# Patient Record
Sex: Female | Born: 1960 | Race: Black or African American | Hispanic: No | Marital: Married | Smoking: Never smoker
Health system: Southern US, Community
[De-identification: ages and names within clinical notes are randomized; demographics above are authoritative.]

## PROBLEM LIST (undated history)

## (undated) DIAGNOSIS — E785 Hyperlipidemia, unspecified: Secondary | ICD-10-CM

## (undated) DIAGNOSIS — I10 Essential (primary) hypertension: Secondary | ICD-10-CM

## (undated) DIAGNOSIS — N390 Urinary tract infection, site not specified: Secondary | ICD-10-CM

## (undated) DIAGNOSIS — B019 Varicella without complication: Secondary | ICD-10-CM

## (undated) HISTORY — DX: Varicella without complication: B01.9

## (undated) HISTORY — DX: Urinary tract infection, site not specified: N39.0

## (undated) HISTORY — PX: TUBAL LIGATION: SHX77

## (undated) HISTORY — DX: Hyperlipidemia, unspecified: E78.5

---

## 2014-12-05 ENCOUNTER — Other Ambulatory Visit: Payer: Self-pay

## 2014-12-05 ENCOUNTER — Emergency Department (HOSPITAL_BASED_OUTPATIENT_CLINIC_OR_DEPARTMENT_OTHER)
Admission: EM | Admit: 2014-12-05 | Discharge: 2014-12-05 | Disposition: A | Discharge status: Home / Self Care | Payer: 59 | Attending: Emergency Medicine | Admitting: Emergency Medicine

## 2014-12-05 ENCOUNTER — Encounter (HOSPITAL_BASED_OUTPATIENT_CLINIC_OR_DEPARTMENT_OTHER): Payer: Self-pay | Admitting: *Deleted

## 2014-12-05 DIAGNOSIS — R05 Cough: Secondary | ICD-10-CM | POA: Diagnosis not present

## 2014-12-05 DIAGNOSIS — R51 Headache: Secondary | ICD-10-CM | POA: Diagnosis present

## 2014-12-05 DIAGNOSIS — I10 Essential (primary) hypertension: Secondary | ICD-10-CM | POA: Diagnosis not present

## 2014-12-05 DIAGNOSIS — R0981 Nasal congestion: Secondary | ICD-10-CM | POA: Insufficient documentation

## 2014-12-05 HISTORY — DX: Essential (primary) hypertension: I10

## 2014-12-05 LAB — CBC WITH DIFFERENTIAL/PLATELET
Basophils Absolute: 0 10*3/uL (ref 0.0–0.1)
Basophils Relative: 0 % (ref 0–1)
EOS ABS: 0.2 10*3/uL (ref 0.0–0.7)
EOS PCT: 3 % (ref 0–5)
HCT: 38.5 % (ref 36.0–46.0)
Hemoglobin: 12.8 g/dL (ref 12.0–15.0)
Lymphocytes Relative: 36 % (ref 12–46)
Lymphs Abs: 2.7 10*3/uL (ref 0.7–4.0)
MCH: 30.4 pg (ref 26.0–34.0)
MCHC: 33.2 g/dL (ref 30.0–36.0)
MCV: 91.4 fL (ref 78.0–100.0)
Monocytes Absolute: 0.7 10*3/uL (ref 0.1–1.0)
Monocytes Relative: 9 % (ref 3–12)
NEUTROS PCT: 52 % (ref 43–77)
Neutro Abs: 3.9 10*3/uL (ref 1.7–7.7)
PLATELETS: 234 10*3/uL (ref 150–400)
RBC: 4.21 MIL/uL (ref 3.87–5.11)
RDW: 12.7 % (ref 11.5–15.5)
WBC: 7.5 10*3/uL (ref 4.0–10.5)

## 2014-12-05 LAB — BASIC METABOLIC PANEL
ANION GAP: 4 — AB (ref 5–15)
BUN: 12 mg/dL (ref 6–23)
CO2: 28 mmol/L (ref 19–32)
CREATININE: 0.74 mg/dL (ref 0.50–1.10)
Calcium: 8.8 mg/dL (ref 8.4–10.5)
Chloride: 106 mmol/L (ref 96–112)
GFR calc Af Amer: >90 mL/min (ref >90)
Glucose, Bld: 107 mg/dL — ABNORMAL HIGH (ref 70–99)
Potassium: 3.1 mmol/L — ABNORMAL LOW (ref 3.5–5.1)
SODIUM: 138 mmol/L (ref 135–145)

## 2014-12-05 LAB — TROPONIN I: Troponin I: <0.03 ng/mL (ref <0.031)

## 2014-12-05 MED ORDER — AMLODIPINE BESY-BENAZEPRIL HCL 10-40 MG PO CAPS: 1.0000 | ORAL_CAPSULE | Freq: Every day | ORAL | Status: DC

## 2014-12-05 MED ORDER — BENAZEPRIL HCL 40 MG PO TABS
40.0000 mg | ORAL_TABLET | Freq: Every day | ORAL | Status: DC
Start: 2014-12-05 — End: 2014-12-06
  Filled 2014-12-05: qty 1

## 2014-12-05 MED ORDER — CLONIDINE HCL 0.1 MG PO TABS
0.2000 mg | ORAL_TABLET | Freq: Once | ORAL | Status: AC
  Administered 2014-12-05: 0.2 mg via ORAL
  Filled 2014-12-05: qty 2

## 2014-12-05 MED ORDER — LABETALOL HCL 5 MG/ML IV SOLN
20.0000 mg | Freq: Once | INTRAVENOUS | Status: AC
  Administered 2014-12-05: 20 mg via INTRAVENOUS
  Filled 2014-12-05: qty 4

## 2014-12-05 NOTE — ED Notes (Signed)
Pt reports she has had head and sinus congestion since Sunday. Pt says she also feels like her blood pressure may be elevated because she feels like her heart is beating fast. Has been out of bp meds for 1 month.

## 2014-12-05 NOTE — ED Provider Notes (Signed)
CSN: [id number]     Arrival date & time 12/05/14  1549 History   First MD Initiated Contact with Patient 12/05/14 1601     Chief Complaint  Patient presents with  . Headache     (Consider location/radiation/quality/duration/timing/severity/associated sxs/prior Treatment) HPI  54 year old female presents with a headache for the past 4-5 days. She states it feels like prior episodes of having a headache when she is having poorly controlled blood pressure. She's been out of her blood pressure medicine for the past 1 week. She no longer has a primary care doctor and does not get another one until April. She states that she's been having sinus congestion with cough and then now has developed this headache seems to be coming and going. There is no current headache at all. No weakness, numbness, or blurry vision. She's not had any chest pain. She has had palpitations, but which she describes as hearing her heartbeat loudly. She states is not hurt, does not skip beats, and is not going fast. She denies a shortness of breath. She took ibuprofen with his headache earlier but currently has no pain.   Past Medical History  Diagnosis Date  . Hypertension    History reviewed. No pertinent past surgical history. No family history on file. History  Substance Use Topics  . Smoking status: Never Smoker   . Smokeless tobacco: Not on file  . Alcohol Use: No   OB History    No data available     Review of Systems  Constitutional: Negative for fever and chills.  HENT: Positive for congestion.   Eyes: Negative for visual disturbance.  Respiratory: Positive for cough. Negative for shortness of breath.   Cardiovascular: Negative for chest pain.  Neurological: Positive for headaches. Negative for weakness and numbness.  All other systems reviewed and are negative.     Allergies  Review of patient's allergies indicates not on file.  Home Medications   Prior to Admission medications   Medication  Sig Start Date End Date Taking? Authorizing Provider  Atorvastatin Calcium (LIPITOR PO) Take by mouth.   Yes Historical Provider, MD   BP 229/104 mmHg  Pulse 77  Temp(Src) 98.4 F (36.9 C) (Oral)  Resp 18  Ht  (1.651 m)  Wt 175 lb (79.379 kg)  BMI 29.12 kg/m2  SpO2 100% Physical Exam  Constitutional: She is oriented to person, place, and time. She appears well-developed and well-nourished.  HENT:  Head: Normocephalic and atraumatic.  Right Ear: External ear normal.  Left Ear: External ear normal.  Nose: Nose normal.  Eyes: EOM are normal. Pupils are equal, round, and reactive to light. Right eye exhibits no discharge. Left eye exhibits no discharge.  Neck: Neck supple.  Cardiovascular: Normal rate, regular rhythm and normal heart sounds.   Pulmonary/Chest: Effort normal and breath sounds normal. She has no wheezes. She has no rales.  Abdominal: Soft. She exhibits no distension. There is no tenderness.  Neurological: She is alert and oriented to person, place, and time.  CN 2-12 grossly intact. 5/5 strength in all 4 extremities  Skin: Skin is warm and dry.  Nursing note and vitals reviewed.   ED Course  Procedures (including critical care time) Labs Review Labs Reviewed  BASIC METABOLIC PANEL - Abnormal; Notable for the following:    Potassium 3.1 (*)    Glucose, Bld 107 (*)    Anion gap 4 (*)    All other components within normal limits  CBC WITH DIFFERENTIAL/PLATELET  TROPONIN  I    Imaging Review No results found.   EKG Interpretation None       Date: 12/05/2014  Rate: 80  Rhythm: normal sinus rhythm  QRS Axis: normal  Intervals: normal  ST/T Wave abnormalities: nonspecific T wave changes  Conduction Disutrbances:none  Narrative Interpretation: T wave inversions inferiorly and V3-V6  Old EKG Reviewed: none available    MDM   Final diagnoses:  Essential hypertension    Patient is currently completely asymptomatic. She denies any headache or  chest pain. She's never had any chest pain, just the feeling that her heart is beating loudly. No prior EKGs in our system and she does have some T-wave inversions. I believe these are most likely due to chronic hypertension. Especially due to the fact that she's been having chest pain has been having this headache for several days with a negative troponin. I will treat her blood pressure with antihypertensives at discharge. Given her mildly low heart rate, will hold on putting her back on Coreg which she states she was on. She states she is on amlodipine and Coreg combined blood pressure medicine. Given she is African-American and will start her on an ACE inhibitor instead in addition to amlodipine given her hypertension. No focal neurologic deficits or current headache to suggest needing a CT scan or further workup. This point she is asymptomatic will give oral antihypertensives and recommend follow-up closely with PCP.    Audree CamelScott T Craige Patel, MD 12/05/14 [phone]

## 2015-01-10 ENCOUNTER — Encounter: Payer: Self-pay | Admitting: Family

## 2015-01-10 ENCOUNTER — Ambulatory Visit (INDEPENDENT_AMBULATORY_CARE_PROVIDER_SITE_OTHER)
Admission: RE | Admit: 2015-01-10 | Discharge: 2015-01-10 | Disposition: A | Discharge status: Home / Self Care | Payer: 59 | Source: Ambulatory Visit | Attending: Family | Admitting: Family

## 2015-01-10 ENCOUNTER — Ambulatory Visit (INDEPENDENT_AMBULATORY_CARE_PROVIDER_SITE_OTHER): Payer: 59 | Admitting: Family

## 2015-01-10 DIAGNOSIS — I10 Essential (primary) hypertension: Secondary | ICD-10-CM | POA: Diagnosis not present

## 2015-01-10 DIAGNOSIS — Z7189 Other specified counseling: Secondary | ICD-10-CM | POA: Diagnosis not present

## 2015-01-10 DIAGNOSIS — M25551 Pain in right hip: Secondary | ICD-10-CM | POA: Diagnosis not present

## 2015-01-10 DIAGNOSIS — Z7689 Persons encountering health services in other specified circumstances: Secondary | ICD-10-CM

## 2015-01-10 MED ORDER — AMLODIPINE BESY-BENAZEPRIL HCL 5-40 MG PO CAPS
1.0000 | ORAL_CAPSULE | Freq: Every day | ORAL | Status: DC
Start: 2015-01-10 — End: 2015-04-24

## 2015-01-10 NOTE — Progress Notes (Signed)
Pre visit review using our clinic review tool, if applicable. No additional management support is needed unless otherwise documented below in the visit note. 

## 2015-01-10 NOTE — Patient Instructions (Addendum)
Thank you for choosing [facility].  Summary/Instructions:  Your prescription(s) have been submitted to your pharmacy or been printed and provided for you. Please take as directed and contact our office if you believe you are having problem(s) with the medication(s) or have any questions.  If your symptoms worsen or fail to improve, please contact our office for further instruction, or in case of emergency go directly to the emergency room at the closest medical facility.   Osteoporosis Throughout your life, your body breaks down old bone and replaces it with new bone. As you get older, your body does not replace bone as quickly as it breaks it down. By the age of 30 years, most people begin to gradually lose bone because of the imbalance between bone loss and replacement. Some people lose more bone than others. Bone loss beyond a specified normal degree is considered osteoporosis.  Osteoporosis affects the strength and durability of your bones. The inside of the ends of your bones and your flat bones, like the bones of your pelvis, look like honeycomb, filled with tiny open spaces. As bone loss occurs, your bones become less dense. This means that the open spaces inside your bones become bigger and the walls between these spaces become thinner. This makes your bones weaker. Bones of a person with osteoporosis can become so weak that they can break (fracture) during minor accidents, such as a simple fall. CAUSES  The following factors have been associated with the development of osteoporosis:  Smoking.  Drinking more than 2 alcoholic drinks several days per week.  Long-term use of certain medicines:  Corticosteroids.  Chemotherapy medicines.  Thyroid medicines.  Antiepileptic medicines.  Gonadal hormone suppression medicine.  Immunosuppression medicine.  Being underweight.  Lack of physical activity.  Lack of exposure to the sun. This can lead to vitamin D  deficiency.  Certain medical conditions:  Certain inflammatory bowel diseases, such as Crohn disease and ulcerative colitis.  Diabetes.  Hyperthyroidism.  Hyperparathyroidism. RISK FACTORS Anyone can develop osteoporosis. However, the following factors can increase your risk of developing osteoporosis:  Gender--Women are at higher risk than men.  Age--Being older than 50 years increases your risk.  Ethnicity--White and Asian people have an increased risk.  Weight --Being extremely underweight can increase your risk of osteoporosis.  Family history of osteoporosis--Having a family member who has developed osteoporosis can increase your risk. SYMPTOMS  Usually, people with osteoporosis have no symptoms.  DIAGNOSIS  Signs during a physical exam that may prompt your caregiver to suspect osteoporosis include:  Decreased height. This is usually caused by the compression of the bones that form your spine (vertebrae) because they have weakened and become fractured.  A curving or rounding of the upper back (kyphosis). To confirm signs of osteoporosis, your caregiver may request a procedure that uses 2 low-dose X-ray beams with different levels of energy to measure your bone mineral density (dual-energy X-ray absorptiometry [DXA]). Also, your caregiver may check your level of vitamin D. TREATMENT  The goal of osteoporosis treatment is to strengthen bones in order to decrease the risk of bone fractures. There are different types of medicines available to help achieve this goal. Some of these medicines work by slowing the processes of bone loss. Some medicines work by increasing bone density. Treatment also involves making sure that your levels of calcium and vitamin D are adequate. PREVENTION  There are things you can do to help prevent osteoporosis. Adequate intake of calcium and vitamin D can help  you achieve optimal bone mineral density. Regular exercise can also help, especially  resistance and weight-bearing activities. If you smoke, quitting smoking is an important part of osteoporosis prevention. MAKE SURE YOU:  Understand these instructions.  Will watch your condition.  Will get help right away if you are not doing well or get worse. FOR MORE INFORMATION www.osteo.org and [employer] Document Released: 07/01/2005 Document Revised: 01/16/2013 Document Reviewed: 09/05/2011 [facility] Patient Information 2015 [place], [place]. This information is not intended to replace advice given to you by your health care provider. Make sure you discuss any questions you have with your health care provider.

## 2015-01-10 NOTE — Assessment & Plan Note (Signed)
Symptoms and exam consistent with potential osteoarthritis right hip. Given previous history of potential osteoporosis on bone marrow density, we'll obtain old records to confirm. Discussed with patient importance of consuming vitamin D and calcium. Obtain x-ray to rule out any fractures. Recommend over-the-counter medications as needed for symptom relief at this time. Pending x-ray results and bone marrow density results referral to orthopedics or sports medicine

## 2015-01-10 NOTE — Assessment & Plan Note (Signed)
Blood pressure remains elevated today above goal of 140/90. Restart amlodipine-benazepril. Patient instructed to take her blood pressure at home periodically. Follow-up in 2 weeks.

## 2015-01-10 NOTE — Progress Notes (Signed)
Subjective:    Patient ID: Patty Adams, female    DOB: 07-Mar-1961, 54 y.o.   MRN: [id number]  Chief Complaint  Patient presents with  . Establish Care    having issues with right hip x8 months, did have a bone density test done and couldn't remember if it showed arthritis or any other diagnosis, also needs refill of BP meds and having BP issues    HPI:  Patty Adams is a 54 y.o. female who presents today to establish care and discuss her hip and blood pressure.   1) Hip Issues - This is a new problem. Associated symptoms pain located in her right hip that has been going on for about 8 months. Denies any trauma to the area. Pain is described as pulling and sharp especially when rising from a sitting position. Pain intensity can reach as high as an 8/10 when moving and when sitting 2/3-10. Denies any modifying factors. Functionality is only impaired when standing, walking is fine. Timing of the symptoms is throughout the day. Currently taking motrin which does not seem to help very much.   2) Blood pressure - Previously diagnosed with blood pressure. Her current treatment regimen consists Lotrel. Notes that she was seen in the ED because of her high blood pressure, but she has run out of medications.   No Known Allergies  Current Outpatient Prescriptions on File Prior to Visit  Medication Sig Dispense Refill  . amLODipine-benazepril (LOTREL) 10-40 MG per capsule Take 1 capsule by mouth daily. 30 capsule 0  . Atorvastatin Calcium (LIPITOR PO) Take by mouth.     No current facility-administered medications on file prior to visit.    Past Medical History  Diagnosis Date  . Hypertension   . Hyperlipidemia   . Chicken pox   . UTI (lower urinary tract infection)     Past Surgical History  Procedure Laterality Date  . Tubal ligation      Family History  Problem Relation Age of Onset  . Hyperlipidemia Mother   . Hypertension Mother   . Hyperlipidemia Father   . Hypertension  Father   . Diabetes Father   . Diabetes Maternal Grandmother   . Hypertension Maternal Grandfather   . Hypertension Paternal Grandmother   . Diabetes Paternal Grandmother     History   Social History  . Marital Status: Married    Spouse Name: N/A  . Number of Children: 3  . Years of Education: 16   Occupational History  . Risk analyst    Social History Main Topics  . Smoking status: Never Smoker   . Smokeless tobacco: Never Used  . Alcohol Use: No     Comment: occasionally  . Drug Use: No  . Sexual Activity: Not on file   Other Topics Concern  . Not on file   Social History Narrative   Fun: Paint, Travel   Denies any religious beliefs effecting health care.     Review of Systems  Respiratory: Negative for chest tightness.   Cardiovascular: Negative for chest pain, palpitations and leg swelling.  Musculoskeletal:       Positive for hip pain  Neurological: Negative for headaches.      Objective:    BP 170/118 mmHg  Pulse 87  Temp(Src) 98 F (36.7 C) (Oral)  Resp 18  Ht  (1.651 m)  Wt 175 lb (79.379 kg)  BMI 29.12 kg/m2  SpO2 98% Nursing note and vital signs reviewed.  Physical Exam  Constitutional:  She is oriented to person, place, and time. She appears well-developed and well-nourished. No distress.  Cardiovascular: Normal rate, regular rhythm, normal heart sounds and intact distal pulses.   Pulmonary/Chest: Effort normal and breath sounds normal.  Musculoskeletal:  No obvious deformity, discoloration, or edema of right hip noted. Palpable tenderness anterior to the greater trochanter. Hip range of motion is full. Distal pulses, sensation and reflexes are all intact. Positive compression test.  Neurological: She is alert and oriented to person, place, and time.  Skin: Skin is warm and dry.  Psychiatric: She has a normal mood and affect. Her behavior is normal. Judgment and thought content normal.       Assessment & Plan:

## 2015-01-11 ENCOUNTER — Telehealth: Payer: Self-pay | Admitting: Family

## 2015-01-11 DIAGNOSIS — M25551 Pain in right hip: Secondary | ICD-10-CM

## 2015-01-11 NOTE — Telephone Encounter (Signed)
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 The radiologist recommended an MRI. We'll schedule an MRI. She should hear back regarding the MRI within the next week.

## 2015-01-11 NOTE — Telephone Encounter (Signed)
Pt aware of results 

## 2015-01-15 ENCOUNTER — Telehealth: Payer: Self-pay | Admitting: Nurse Practitioner

## 2015-01-15 NOTE — Telephone Encounter (Signed)
Called and left patient a message to schedule a new patient doctor referral for an AEX.

## 2015-01-17 NOTE — Telephone Encounter (Signed)
Called and left a message for patient to call back to schedule a new patient doctor referral. °

## 2015-01-22 NOTE — Telephone Encounter (Signed)
Called and left a message for patient to call back to schedule a new patient doctor referral. °

## 2015-01-24 NOTE — Telephone Encounter (Signed)
Called and left a message for patient to call back to schedule a new patient doctor referral. °

## 2015-01-25 NOTE — Telephone Encounter (Signed)
Sent referring provider a message to re-refer patient when she is ready to schedule due to patient not returning calls to schedule.

## 2015-02-06 ENCOUNTER — Telehealth: Payer: Self-pay

## 2015-02-06 NOTE — Telephone Encounter (Signed)
02/06/15 [facility] received from mail room and put on dumbwaiter for [facility] FNP. Jeneen Rinks/IH

## 2015-02-16 ENCOUNTER — Ambulatory Visit
Admission: RE | Admit: 2015-02-16 | Discharge: 2015-02-16 | Disposition: A | Discharge status: Home / Self Care | Payer: 59 | Source: Ambulatory Visit | Attending: Family | Admitting: Family

## 2015-02-16 DIAGNOSIS — M25551 Pain in right hip: Secondary | ICD-10-CM

## 2015-02-19 ENCOUNTER — Telehealth: Payer: Self-pay | Admitting: Family

## 2015-02-19 DIAGNOSIS — M25551 Pain in right hip: Secondary | ICD-10-CM

## 2015-02-19 NOTE — Telephone Encounter (Signed)
Please inform the patient that the MRI of her hip showed minimal degeneration or masses. I am going to refer to Sports Medicine if she would like for additional treatments.

## 2015-02-19 NOTE — Telephone Encounter (Signed)
Pt aware of results written below. She wants more detail. She wants to know what are masses?

## 2015-02-19 NOTE — Assessment & Plan Note (Signed)
MRI reveals no masses and mild degeneration of right hip.

## 2015-02-20 NOTE — Telephone Encounter (Signed)
There were no masses on her MRI, I apologize for the confusion. The reason I am sending her to Sports Medicine is for potential cortisone injection and he can look at her hip with the ultrasound.

## 2015-03-13 ENCOUNTER — Encounter: Payer: Self-pay | Admitting: Family

## 2015-03-27 ENCOUNTER — Encounter: Payer: Self-pay | Admitting: Family

## 2015-03-27 ENCOUNTER — Ambulatory Visit (INDEPENDENT_AMBULATORY_CARE_PROVIDER_SITE_OTHER): Payer: 59 | Admitting: Family

## 2015-03-27 VITALS — BP 190/118 | HR 71 | Temp 98.0°F | Resp 18 | Ht 65.0 in | Wt 173.0 lb

## 2015-03-27 DIAGNOSIS — F411 Generalized anxiety disorder: Secondary | ICD-10-CM | POA: Diagnosis not present

## 2015-03-27 DIAGNOSIS — I10 Essential (primary) hypertension: Secondary | ICD-10-CM | POA: Diagnosis not present

## 2015-03-27 DIAGNOSIS — R8290 Unspecified abnormal findings in urine: Secondary | ICD-10-CM | POA: Diagnosis not present

## 2015-03-27 LAB — POCT URINALYSIS DIPSTICK
Bilirubin, UA: NEGATIVE
Blood, UA: NEGATIVE
Glucose, UA: NEGATIVE
Ketones, UA: NEGATIVE
Leukocytes, UA: NEGATIVE
Nitrite, UA: NEGATIVE
PROTEIN UA: NEGATIVE
SPEC GRAV UA: 1.01
UROBILINOGEN UA: NEGATIVE
pH, UA: 6

## 2015-03-27 MED ORDER — CLONAZEPAM 0.5 MG PO TABS: 0.5000 mg | ORAL_TABLET | Freq: Two times a day (BID) | ORAL | Status: DC | PRN

## 2015-03-27 MED ORDER — HYDROCHLOROTHIAZIDE 25 MG PO TABS: 12.5000 mg | ORAL_TABLET | Freq: Every day | ORAL | Status: DC

## 2015-03-27 NOTE — Progress Notes (Signed)
Subjective:    Patient ID: Patty Adams, female    DOB: 01-10-1961, 54 y.o.   MRN: [id number]  Chief Complaint  Patient presents with  . Medication follow up    BP is still reading , needs refill of atorvastatin and bp meds, having odor to urine, doesn't know if it could be a bladder infection     HPI:  Patty Adams is a 54 y.o. female with a PMH of hypertension, hyperlipidemia, and tubal ligation who presents today for an office follow-up.  1.) Hypertension - Recently seen in the office for hypertension and restarted on amlodipine-benazepril. Reports she takes her medication as prescribed and denies adverse side effects. Indicates that she takes her blood pressure at home and averaging around 150/90.   BP Readings from Last 3 Encounters:  03/27/15 190/118  01/10/15 170/118  12/05/14 211/98   2.) Urine odor - This is a new problem. Indicates that she moved into a new house and notes that there is a change in urinary odor. Denies any urinary frequency, urgency, flank pain or dysuria.    3.) Anxiety - Associated symptom of increased anxiety has been going on for about the last 3 months. Notes that she has had a lot of family issues with deaths and also notes that she gets nervous when she has to speak in front of people. Notes that she sleeps well.   No Known Allergies  Current Outpatient Prescriptions on File Prior to Visit  Medication Sig Dispense Refill  . amLODipine-benazepril (LOTREL) 5-40 MG per capsule Take 1 capsule by mouth daily. 30 capsule 3  . Atorvastatin Calcium (LIPITOR PO) Take by mouth.     No current facility-administered medications on file prior to visit.   Past Medical History  Diagnosis Date  . Hypertension   . Hyperlipidemia   . Chicken pox   . UTI (lower urinary tract infection)      Review of Systems  Constitutional: Negative for fever and chills.  Eyes:       Negative for changes in vision.   Respiratory: Negative for chest tightness.     Cardiovascular: Negative for chest pain, palpitations and leg swelling.  Genitourinary: Negative for dysuria, urgency, frequency, hematuria and flank pain.  Psychiatric/Behavioral: Negative for suicidal ideas and decreased concentration. The patient is nervous/anxious.       Objective:    BP 190/118 mmHg  Pulse 71  Temp(Src) 98 F (36.7 C) (Oral)  Resp 18  Ht 5\' 5"  (1.651 m)  Wt 173 lb (78.472 kg)  BMI 28.79 kg/m2  SpO2 96% Nursing note and vital signs reviewed.  Physical Exam  Constitutional: She is oriented to person, place, and time. She appears well-developed and well-nourished. No distress.  Cardiovascular: Normal rate, regular rhythm, normal heart sounds and intact distal pulses.   Pulmonary/Chest: Effort normal and breath sounds normal.  Neurological: She is alert and oriented to person, place, and time.  Skin: Skin is warm and dry.  Psychiatric: Her behavior is normal. Judgment and thought content normal. Her mood appears anxious.       Assessment & Plan:   Problem List Items Addressed This Visit      Cardiovascular and Mediastinum   Essential hypertension - Primary    Hypertension remains uncontrolled with blood pressure greater than goal of 140/90 as current regimen. Continue current dosage of amlodipine-benazepril. Start 12.5 mg of hydrochlorothiazide daily. If in 1 week pressures have not improved, increase hydrochlorothiazide to 25 milligrams daily. Follow-up in 3  weeks.      Relevant Medications   hydrochlorothiazide (HYDRODIURIL) 25 MG tablet     Other   Anxiety state    Symptoms and exam consistent with anxiety state and inability to cope with current situation regarding family and outside stressors. Discussed nonpharmacological treatment including deep breathing and removing herself from the situation. Start clonazepam as needed for anxiety. Follow-up if symptoms worsen or fail to improve.      Relevant Medications   clonazePAM (KLONOPIN) 0.5 MG tablet    Abnormal urine odor    In office urinalysis negative for leukocytes, nitrites, and hematuria. Given lack of symptoms of urinary tract infection, continue to monitor at this time with no interventions. Continue to drink plenty of fluids and follow-up if symptoms worsen or fail to improve.      Relevant Orders   POCT urinalysis dipstick (Completed)

## 2015-03-27 NOTE — Assessment & Plan Note (Signed)
Hypertension remains uncontrolled with blood pressure greater than goal of 140/90 as current regimen. Continue current dosage of amlodipine-benazepril. Start 12.5 mg of hydrochlorothiazide daily. If in 1 week pressures have not improved, increase hydrochlorothiazide to 25 milligrams daily. Follow-up in 3 weeks.

## 2015-03-27 NOTE — Assessment & Plan Note (Signed)
In office urinalysis negative for leukocytes, nitrites, and hematuria. Given lack of symptoms of urinary tract infection, continue to monitor at this time with no interventions. Continue to drink plenty of fluids and follow-up if symptoms worsen or fail to improve.

## 2015-03-27 NOTE — Patient Instructions (Signed)
Thank you for choosing [facility].  Summary/Instructions:  Please continue to take your medications as prescribed.  Start the hydrochlorothiazide with 0.5 tablets daily and increase to 1 tablet if your blood pressure remains elevated.   For anxiety - please take 1 pill up to 2 x daily as needed.   Your prescription(s) have been submitted to your pharmacy or been printed and provided for you. Please take as directed and contact our office if you believe you are having problem(s) with the medication(s) or have any questions.  If your symptoms worsen or fail to improve, please contact our office for further instruction, or in case of emergency go directly to the emergency room at the closest medical facility.

## 2015-03-27 NOTE — Progress Notes (Signed)
Pre visit review using our clinic review tool, if applicable. No additional management support is needed unless otherwise documented below in the visit note. 

## 2015-03-27 NOTE — Assessment & Plan Note (Signed)
Symptoms and exam consistent with anxiety state and inability to cope with current situation regarding family and outside stressors. Discussed nonpharmacological treatment including deep breathing and removing herself from the situation. Start clonazepam as needed for anxiety. Follow-up if symptoms worsen or fail to improve.

## 2015-04-24 ENCOUNTER — Other Ambulatory Visit: Payer: Self-pay | Admitting: Family

## 2015-04-25 ENCOUNTER — Other Ambulatory Visit: Payer: Self-pay | Admitting: Family

## 2015-05-03 ENCOUNTER — Telehealth: Payer: Self-pay

## 2015-05-03 NOTE — Telephone Encounter (Signed)
Left message reminding patient that mammogram is due 

## 2015-07-01 ENCOUNTER — Other Ambulatory Visit: Payer: Self-pay | Admitting: Family

## 2015-08-02 ENCOUNTER — Other Ambulatory Visit: Payer: Self-pay | Admitting: Family

## 2015-11-01 LAB — HM MAMMOGRAPHY: HM MAMMO: NEGATIVE

## 2015-11-06 ENCOUNTER — Ambulatory Visit: Payer: 59 | Admitting: Family

## 2015-11-11 ENCOUNTER — Encounter: Payer: Self-pay | Admitting: Family

## 2015-11-12 ENCOUNTER — Encounter: Payer: Self-pay | Admitting: Family

## 2015-11-12 ENCOUNTER — Ambulatory Visit (INDEPENDENT_AMBULATORY_CARE_PROVIDER_SITE_OTHER): Payer: 59 | Admitting: Family

## 2015-11-12 VITALS — BP 154/100 | HR 68 | Temp 98.0°F | Resp 16 | Ht 65.0 in | Wt 175.0 lb

## 2015-11-12 DIAGNOSIS — I10 Essential (primary) hypertension: Secondary | ICD-10-CM

## 2015-11-12 DIAGNOSIS — R109 Unspecified abdominal pain: Secondary | ICD-10-CM | POA: Diagnosis not present

## 2015-11-12 DIAGNOSIS — M545 Low back pain, unspecified: Secondary | ICD-10-CM

## 2015-11-12 DIAGNOSIS — F411 Generalized anxiety disorder: Secondary | ICD-10-CM

## 2015-11-12 DIAGNOSIS — M25551 Pain in right hip: Secondary | ICD-10-CM

## 2015-11-12 DIAGNOSIS — R10A0 Flank pain, unspecified side: Secondary | ICD-10-CM

## 2015-11-12 LAB — POCT URINALYSIS DIPSTICK
BILIRUBIN UA: NEGATIVE
Clarity, UA: NEGATIVE
Glucose, UA: NEGATIVE
Ketones, UA: NEGATIVE
LEUKOCYTES UA: NEGATIVE
Nitrite, UA: NEGATIVE
Protein, UA: NEGATIVE
RBC UA: NEGATIVE
Spec Grav, UA: 1.015
Urobilinogen, UA: NEGATIVE
pH, UA: 6

## 2015-11-12 MED ORDER — DICLOFENAC SODIUM 2 % TD SOLN: 1.0000 "application " | Freq: Two times a day (BID) | TRANSDERMAL | Status: DC | PRN

## 2015-11-12 MED ORDER — AMLODIPINE BESY-BENAZEPRIL HCL 10-40 MG PO CAPS: 1.0000 | ORAL_CAPSULE | Freq: Every day | ORAL | Status: DC

## 2015-11-12 MED ORDER — CLONAZEPAM 0.5 MG PO TABS: 0.5000 mg | ORAL_TABLET | Freq: Two times a day (BID) | ORAL | Status: DC | PRN

## 2015-11-12 MED ORDER — NAPROXEN-ESOMEPRAZOLE 500-20 MG PO TBEC
1.0000 | DELAYED_RELEASE_TABLET | Freq: Two times a day (BID) | ORAL | Status: DC | PRN
Start: 2015-11-12 — End: 2016-12-07

## 2015-11-12 NOTE — Patient Instructions (Addendum)
Thank you for choosing [facility].  Summary/Instructions:  Your prescription(s) have been submitted to your pharmacy or been printed and provided for you. Please take as directed and contact our office if you believe you are having problem(s) with the medication(s) or have any questions.  If your symptoms worsen or fail to improve, please contact our office for further instruction, or in case of emergency go directly to the emergency room at the closest medical facility.   Please start Tumeric 500 mg daily Please start Vitamin D 2000 IU daily Pennsaid - Pinkie sized amount on the sore areas 2x per day as needed. Vimovo as needed. Ice 2-3 times per day back and hip Exercises daily.  Generic Hip Exercises RANGE OF MOTION (ROM) AND STRETCHING EXERCISES  These exercises may help you when beginning to rehabilitate your injury. Doing them too aggressively can worsen your condition. Complete them slowly and gently. Your symptoms may resolve with or without further involvement from your physician, physical therapist or athletic trainer. While completing these exercises, remember:   Restoring tissue flexibility helps normal motion to return to the joints. This allows healthier, less painful movement and activity.  An effective stretch should be held for at least 30 seconds.  A stretch should never be painful. You should only feel a gentle lengthening or release in the stretched tissue. If these stretches worsen your symptoms even when done gently, consult your physician, physical therapist or athletic trainer. STRETCH - Hamstrings, Supine   Lie on your back. Loop a belt or towel over the ball of your right / left foot.  Straighten your right / left knee and slowly pull on the belt to raise your leg. Do not allow the right / left knee to bend. Keep your opposite leg flat on the floor.  Raise the leg until you feel a gentle stretch behind your right / left knee or thigh. Hold this position  for __________ seconds. Repeat __________ times. Complete this stretch __________ times per day.  STRETCH - Hip Rotators   Lie on your back on a firm surface. Grasp your right / left knee with your right / left hand and your ankle with your opposite hand.  Keeping your hips and shoulders firmly planted, gently pull your right / left knee and rotate your lower leg toward your opposite shoulder until you feel a stretch in your buttocks.  Hold this stretch for __________ seconds. Repeat this stretch __________ times. Complete this stretch __________ times per day. STRETCH - Hamstrings/Adductors, V-Sit   Sit on the floor with your legs extended in a large "V," keeping your knees straight.  With your head and chest upright, bend at your waist reaching for your right foot to stretch your left adductors.  You should feel a stretch in your left inner thigh. Hold for __________ seconds.  Return to the upright position to relax your leg muscles.  Continuing to keep your chest upright, bend straight forward at your waist to stretch your hamstrings.  You should feel a stretch behind both of your thighs and/or knees. Hold for __________ seconds.  Return to the upright position to relax your leg muscles.  Repeat steps 2 through 4 for opposite leg. Repeat __________ times. Complete this exercise __________ times per day.  STRETCHING - Hip Flexors, Lunge  Half kneel with your right / left knee on the floor and your opposite knee bent and directly over your ankle.  Keep good posture with your head over your shoulders. Tighten  your buttocks to point your tailbone downward; this will prevent your back from arching too much.  You should feel a gentle stretch in the front of your thigh and/or hip. If you do not feel any resistance, slightly slide your opposite foot forward and then slowly lunge forward so your knee once again lines up over your ankle. Be sure your tailbone remains pointed  downward.  Hold this stretch for __________ seconds. Repeat __________ times. Complete this stretch __________ times per day. STRENGTHENING EXERCISES These exercises may help you when beginning to rehabilitate your injury. They may resolve your symptoms with or without further involvement from your physician, physical therapist or athletic trainer. While completing these exercises, remember:   Muscles can gain both the endurance and the strength needed for everyday activities through controlled exercises.  Complete these exercises as instructed by your physician, physical therapist or athletic trainer. Progress the resistance and repetitions only as guided.  You may experience muscle soreness or fatigue, but the pain or discomfort you are trying to eliminate should never worsen during these exercises. If this pain does worsen, stop and make certain you are following the directions exactly. If the pain is still present after adjustments, discontinue the exercise until you can discuss the trouble with your clinician. STRENGTH - Hip Extensors, Bridge   Lie on your back on a firm surface. Bend your knees and place your feet flat on the floor.  Tighten your buttocks muscles and lift your bottom off the floor until your trunk is level with your thighs. You should feel the muscles in your buttocks and back of your thighs working. If you do not feel these muscles, slide your feet 1-2 inches further away from your buttocks.  Hold this position for __________ seconds.  Slowly lower your hips to the starting position and allow your buttock muscles relax completely before beginning the next repetition.  If this exercise is too easy, you may cross your arms over your chest. Repeat __________ times. Complete this exercise __________ times per day.  STRENGTH - Hip Abductors, Straight Leg Raises  Be aware of your form throughout the entire exercise so that you exercise the correct muscles. Sloppy form means  that you are not strengthening the correct muscles.  Lie on your side so that your head, shoulders, knee and hip line up. You may bend your lower knee to help maintain your balance. Your right / left leg should be on top.  Roll your hips slightly forward, so that your hips are stacked directly over each other and your right / left knee is facing forward.  Lift your top leg up 4-6 inches, leading with your heel. Be sure that your foot does not drift forward or that your knee does not roll toward the ceiling.  Hold this position for __________ seconds. You should feel the muscles in your outer hip lifting (you may not notice this until your leg begins to tire).  Slowly lower your leg to the starting position. Allow the muscles to fully relax before beginning the next repetition. Repeat __________ times. Complete this exercise __________ times per day.  STRENGTH - Hip Adductors, Straight Leg Raises   Lie on your side so that your head, shoulders, knee and hip line up. You may place your upper foot in front to help maintain your balance. Your right / left leg should be on the bottom.  Roll your hips slightly forward, so that your hips are stacked directly over each other and your  right / left knee is facing forward.  Tense the muscles in your inner thigh and lift your bottom leg 4-6 inches. Hold this position for __________ seconds.  Slowly lower your leg to the starting position. Allow the muscles to fully relax before beginning the next repetition. Repeat __________ times. Complete this exercise __________ times per day.  STRENGTH - Quadriceps, Straight Leg Raises  Quality counts! Watch for signs that the quadriceps muscle is working to insure you are strengthening the correct muscles and not "cheating" by substituting with healthier muscles.  Lay on your back with your right / left leg extended and your opposite knee bent.  Tense the muscles in the front of your right / left thigh. You  should see either your knee cap slide up or increased dimpling just above the knee. Your thigh may even quiver.  Tighten these muscles even more and raise your leg 4 to 6 inches off the floor. Hold for right / left seconds.  Keeping these muscles tense, lower your leg.  Relax the muscles slowly and completely in between each repetition. Repeat __________ times. Complete this exercise __________ times per day.  STRENGTH - Hip Abductors, Standing  Tie one end of a rubber exercise band/tubing to a secure surface (table, pole) and tie a loop at the other end.  Place the loop around your right / left ankle. Keeping your ankle with the band directly opposite of the secured end, step away until there is tension in the tube/band.  Hold onto a chair as needed for balance.  Keeping your back upright, your shoulders over your hips, and your toes pointing forward, lift your right / left leg out to your side. Be sure to lift your leg with your hip muscles. Do not "throw" your leg or tip your body to lift your leg.  Slowly and with control, return to the starting position. Repeat exercise __________ times. Complete this exercise __________ times per day.  STRENGTH - Quadriceps, Squats  Stand in a door frame so that your feet and knees are in line with the frame.  Use your hands for balance, not support, on the frame.  Slowly lower your weight, bending at the hips and knees. Keep your lower legs upright so that they are parallel with the door frame. Squat only within the range that does not increase your knee pain. Never let your hips drop below your knees.  Slowly return upright, pushing with your legs, not pulling with your hands.   This information is not intended to replace advice given to you by your health care provider. Make sure you discuss any questions you have with your health care provider.   Document Released: 10/09/2005 Document Revised: 10/12/2014 Document Reviewed:  01/03/2009 [employer] Interactive Patient Education 2016 [employer]  Sciatica With Rehab The sciatic nerve runs from the back down the leg and is responsible for sensation and control of the muscles in the back (posterior) side of the thigh, lower leg, and foot. Sciatica is a condition that is characterized by inflammation of this nerve.  SYMPTOMS   Signs of nerve damage, including numbness and/or weakness along the posterior side of the lower extremity.  Pain in the back of the thigh that may also travel down the leg.  Pain that worsens when sitting for long periods of time.  Occasionally, pain in the back or buttock. CAUSES  Inflammation of the sciatic nerve is the cause of sciatica. The inflammation is due to something irritating the nerve.  Common sources of irritation include:  Sitting for long periods of time.  Direct trauma to the nerve.  Arthritis of the spine.  Herniated or ruptured disk.  Slipping of the vertebrae (spondylolisthesis).  Pressure from soft tissues, such as muscles or ligament-like tissue (fascia). RISK INCREASES WITH:  Sports that place pressure or stress on the spine (football or weightlifting).  Poor strength and flexibility.  Failure to warm up properly before activity.  Family history of low back pain or disk disorders.  Previous back injury or surgery.  Poor body mechanics, especially when lifting, or poor posture. PREVENTION   Warm up and stretch properly before activity.  Maintain physical fitness:  Strength, flexibility, and endurance.  Cardiovascular fitness.  Learn and use proper technique, especially with posture and lifting. When possible, have coach correct improper technique.  Avoid activities that place stress on the spine. PROGNOSIS If treated properly, then sciatica usually resolves within 6 weeks. However, occasionally surgery is necessary.  RELATED COMPLICATIONS   Permanent nerve damage, including pain, numbness,  tingle, or weakness.  Chronic back pain.  Risks of surgery: infection, bleeding, nerve damage, or damage to surrounding tissues. TREATMENT Treatment initially involves resting from any activities that aggravate your symptoms. The use of ice and medication may help reduce pain and inflammation. The use of strengthening and stretching exercises may help reduce pain with activity. These exercises may be performed at home or with referral to a therapist. A therapist may recommend further treatments, such as transcutaneous electronic nerve stimulation (TENS) or ultrasound. Your caregiver may recommend corticosteroid injections to help reduce inflammation of the sciatic nerve. If symptoms persist despite non-surgical (conservative) treatment, then surgery may be recommended. MEDICATION  If pain medication is necessary, then nonsteroidal anti-inflammatory medications, such as aspirin and ibuprofen, or other minor pain relievers, such as acetaminophen, are often recommended.  Do not take pain medication for 7 days before surgery.  Prescription pain relievers may be given if deemed necessary by your caregiver. Use only as directed and only as much as you need.  Ointments applied to the skin may be helpful.  Corticosteroid injections may be given by your caregiver. These injections should be reserved for the most serious cases, because they may only be given a certain number of times. HEAT AND COLD  Cold treatment (icing) relieves pain and reduces inflammation. Cold treatment should be applied for 10 to 15 minutes every 2 to 3 hours for inflammation and pain and immediately after any activity that aggravates your symptoms. Use ice packs or massage the area with a piece of ice (ice massage).  Heat treatment may be used prior to performing the stretching and strengthening activities prescribed by your caregiver, physical therapist, or athletic trainer. Use a heat pack or soak the injury in warm  water. SEEK MEDICAL CARE IF:  Treatment seems to offer no benefit, or the condition worsens.  Any medications produce adverse side effects. EXERCISES  RANGE OF MOTION (ROM) AND STRETCHING EXERCISES - Sciatica Most people with sciatic will find that their symptoms worsen with either excessive bending forward (flexion) or arching at the low back (extension). The exercises which will help resolve your symptoms will focus on the opposite motion. Your physician, physical therapist or athletic trainer will help you determine which exercises will be most helpful to resolve your low back pain. Do not complete any exercises without first consulting with your clinician. Discontinue any exercises which worsen your symptoms until you speak to your clinician. If you have pain,  numbness or tingling which travels down into your buttocks, leg or foot, the goal of the therapy is for these symptoms to move closer to your back and eventually resolve. Occasionally, these leg symptoms will get better, but your low back pain may worsen; this is typically an indication of progress in your rehabilitation. Be certain to be very alert to any changes in your symptoms and the activities in which you participated in the 24 hours prior to the change. Sharing this information with your clinician will allow him/her to most efficiently treat your condition. These exercises may help you when beginning to rehabilitate your injury. Your symptoms may resolve with or without further involvement from your physician, physical therapist or athletic trainer. While completing these exercises, remember:   Restoring tissue flexibility helps normal motion to return to the joints. This allows healthier, less painful movement and activity.  An effective stretch should be held for at least 30 seconds.  A stretch should never be painful. You should only feel a gentle lengthening or release in the stretched tissue. FLEXION RANGE OF MOTION AND  STRETCHING EXERCISES: STRETCH - Flexion, Single Knee to Chest   Lie on a firm bed or floor with both legs extended in front of you.  Keeping one leg in contact with the floor, bring your opposite knee to your chest. Hold your leg in place by either grabbing behind your thigh or at your knee.  Pull until you feel a gentle stretch in your low back. Hold __________ seconds.  Slowly release your grasp and repeat the exercise with the opposite side. Repeat __________ times. Complete this exercise __________ times per day.  STRETCH - Flexion, Double Knee to Chest  Lie on a firm bed or floor with both legs extended in front of you.  Keeping one leg in contact with the floor, bring your opposite knee to your chest.  Tense your stomach muscles to support your back and then lift your other knee to your chest. Hold your legs in place by either grabbing behind your thighs or at your knees.  Pull both knees toward your chest until you feel a gentle stretch in your low back. Hold __________ seconds.  Tense your stomach muscles and slowly return one leg at a time to the floor. Repeat __________ times. Complete this exercise __________ times per day.  STRETCH - Low Trunk Rotation   Lie on a firm bed or floor. Keeping your legs in front of you, bend your knees so they are both pointed toward the ceiling and your feet are flat on the floor.  Extend your arms out to the side. This will stabilize your upper body by keeping your shoulders in contact with the floor.  Gently and slowly drop both knees together to one side until you feel a gentle stretch in your low back. Hold for __________ seconds.  Tense your stomach muscles to support your low back as you bring your knees back to the starting position. Repeat the exercise to the other side. Repeat __________ times. Complete this exercise __________ times per day  EXTENSION RANGE OF MOTION AND FLEXIBILITY EXERCISES: STRETCH - Extension, Prone on  Elbows  Lie on your stomach on the floor, a bed will be too soft. Place your palms about shoulder width apart and at the height of your head.  Place your elbows under your shoulders. If this is too painful, stack pillows under your chest.  Allow your body to relax so that your hips drop lower  and make contact more completely with the floor.  Hold this position for __________ seconds.  Slowly return to lying flat on the floor. Repeat __________ times. Complete this exercise __________ times per day.  RANGE OF MOTION - Extension, Prone Press Ups  Lie on your stomach on the floor, a bed will be too soft. Place your palms about shoulder width apart and at the height of your head.  Keeping your back as relaxed as possible, slowly straighten your elbows while keeping your hips on the floor. You may adjust the placement of your hands to maximize your comfort. As you gain motion, your hands will come more underneath your shoulders.  Hold this position __________ seconds.  Slowly return to lying flat on the floor. Repeat __________ times. Complete this exercise __________ times per day.  STRENGTHENING EXERCISES - Sciatica  These exercises may help you when beginning to rehabilitate your injury. These exercises should be done near your "sweet spot." This is the neutral, low-back arch, somewhere between fully rounded and fully arched, that is your least painful position. When performed in this safe range of motion, these exercises can be used for people who have either a flexion or extension based injury. These exercises may resolve your symptoms with or without further involvement from your physician, physical therapist or athletic trainer. While completing these exercises, remember:   Muscles can gain both the endurance and the strength needed for everyday activities through controlled exercises.  Complete these exercises as instructed by your physician, physical therapist or athletic trainer.  Progress with the resistance and repetition exercises only as your caregiver advises.  You may experience muscle soreness or fatigue, but the pain or discomfort you are trying to eliminate should never worsen during these exercises. If this pain does worsen, stop and make certain you are following the directions exactly. If the pain is still present after adjustments, discontinue the exercise until you can discuss the trouble with your clinician. STRENGTHENING - Deep Abdominals, Pelvic Tilt   Lie on a firm bed or floor. Keeping your legs in front of you, bend your knees so they are both pointed toward the ceiling and your feet are flat on the floor.  Tense your lower abdominal muscles to press your low back into the floor. This motion will rotate your pelvis so that your tail bone is scooping upwards rather than pointing at your feet or into the floor.  With a gentle tension and even breathing, hold this position for __________ seconds. Repeat __________ times. Complete this exercise __________ times per day.  STRENGTHENING - Abdominals, Crunches   Lie on a firm bed or floor. Keeping your legs in front of you, bend your knees so they are both pointed toward the ceiling and your feet are flat on the floor. Cross your arms over your chest.  Slightly tip your chin down without bending your neck.  Tense your abdominals and slowly lift your trunk high enough to just clear your shoulder blades. Lifting higher can put excessive stress on the low back and does not further strengthen your abdominal muscles.  Control your return to the starting position. Repeat __________ times. Complete this exercise __________ times per day.  STRENGTHENING - Quadruped, Opposite UE/LE Lift  Assume a hands and knees position on a firm surface. Keep your hands under your shoulders and your knees under your hips. You may place padding under your knees for comfort.  Find your neutral spine and gently tense your abdominal  muscles so  that you can maintain this position. Your shoulders and hips should form a rectangle that is parallel with the floor and is not twisted.  Keeping your trunk steady, lift your right hand no higher than your shoulder and then your left leg no higher than your hip. Make sure you are not holding your breath. Hold this position __________ seconds.  Continuing to keep your abdominal muscles tense and your back steady, slowly return to your starting position. Repeat with the opposite arm and leg. Repeat __________ times. Complete this exercise __________ times per day.  STRENGTHENING - Abdominals and Quadriceps, Straight Leg Raise   Lie on a firm bed or floor with both legs extended in front of you.  Keeping one leg in contact with the floor, bend the other knee so that your foot can rest flat on the floor.  Find your neutral spine, and tense your abdominal muscles to maintain your spinal position throughout the exercise.  Slowly lift your straight leg off the floor about 6 inches for a count of 15, making sure to not hold your breath.  Still keeping your neutral spine, slowly lower your leg all the way to the floor. Repeat this exercise with each leg __________ times. Complete this exercise __________ times per day. POSTURE AND BODY MECHANICS CONSIDERATIONS - Sciatica Keeping correct posture when sitting, standing or completing your activities will reduce the stress put on different body tissues, allowing injured tissues a chance to heal and limiting painful experiences. The following are general guidelines for improved posture. Your physician or physical therapist will provide you with any instructions specific to your needs. While reading these guidelines, remember:  The exercises prescribed by your provider will help you have the flexibility and strength to maintain correct postures.  The correct posture provides the optimal environment for your joints to work. All of your joints have  less wear and tear when properly supported by a spine with good posture. This means you will experience a healthier, less painful body.  Correct posture must be practiced with all of your activities, especially prolonged sitting and standing. Correct posture is as important when doing repetitive low-stress activities (typing) as it is when doing a single heavy-load activity (lifting). RESTING POSITIONS Consider which positions are most painful for you when choosing a resting position. If you have pain with flexion-based activities (sitting, bending, stooping, squatting), choose a position that allows you to rest in a less flexed posture. You would want to avoid curling into a fetal position on your side. If your pain worsens with extension-based activities (prolonged standing, working overhead), avoid resting in an extended position such as sleeping on your stomach. Most people will find more comfort when they rest with their spine in a more neutral position, neither too rounded nor too arched. Lying on a non-sagging bed on your side with a pillow between your knees, or on your back with a pillow under your knees will often provide some relief. Keep in mind, being in any one position for a prolonged period of time, no matter how correct your posture, can still lead to stiffness. PROPER SITTING POSTURE In order to minimize stress and discomfort on your spine, you must sit with correct posture Sitting with good posture should be effortless for a healthy body. Returning to good posture is a gradual process. Many people can work toward this most comfortably by using various supports until they have the flexibility and strength to maintain this posture on their own. When sitting with  proper posture, your ears will fall over your shoulders and your shoulders will fall over your hips. You should use the back of the chair to support your upper back. Your low back will be in a neutral position, just slightly arched.  You may place a small pillow or folded towel at the base of your low back for support.  When working at a desk, create an environment that supports good, upright posture. Without extra support, muscles fatigue and lead to excessive strain on joints and other tissues. Keep these recommendations in mind: CHAIR:   A chair should be able to slide under your desk when your back makes contact with the back of the chair. This allows you to work closely.  The chair's height should allow your eyes to be level with the upper part of your monitor and your hands to be slightly lower than your elbows. BODY POSITION  Your feet should make contact with the floor. If this is not possible, use a foot rest.  Keep your ears over your shoulders. This will reduce stress on your neck and low back. INCORRECT SITTING POSTURES   If you are feeling tired and unable to assume a healthy sitting posture, do not slouch or slump. This puts excessive strain on your back tissues, causing more damage and pain. Healthier options include:  Using more support, like a lumbar pillow.  Switching tasks to something that requires you to be upright or walking.  Talking a brief walk.  Lying down to rest in a neutral-spine position. PROLONGED STANDING WHILE SLIGHTLY LEANING FORWARD  When completing a task that requires you to lean forward while standing in one place for a long time, place either foot up on a stationary 2-4 inch high object to help maintain the best posture. When both feet are on the ground, the low back tends to lose its slight inward curve. If this curve flattens (or becomes too large), then the back and your other joints will experience too much stress, fatigue more quickly and can cause pain.  CORRECT STANDING POSTURES Proper standing posture should be assumed with all daily activities, even if they only take a few moments, like when brushing your teeth. As in sitting, your ears should fall over your shoulders and  your shoulders should fall over your hips. You should keep a slight tension in your abdominal muscles to brace your spine. Your tailbone should point down to the ground, not behind your body, resulting in an over-extended swayback posture.  INCORRECT STANDING POSTURES  Common incorrect standing postures include a forward head, locked knees and/or an excessive swayback. WALKING Walk with an upright posture. Your ears, shoulders and hips should all line-up. PROLONGED ACTIVITY IN A FLEXED POSITION When completing a task that requires you to bend forward at your waist or lean over a low surface, try to find a way to stabilize 3 of 4 of your limbs. You can place a hand or elbow on your thigh or rest a knee on the surface you are reaching across. This will provide you more stability so that your muscles do not fatigue as quickly. By keeping your knees relaxed, or slightly bent, you will also reduce stress across your low back. CORRECT LIFTING TECHNIQUES DO :   Assume a wide stance. This will provide you more stability and the opportunity to get as close as possible to the object which you are lifting.  Tense your abdominals to brace your spine; then bend at the knees and  hips. Keeping your back locked in a neutral-spine position, lift using your leg muscles. Lift with your legs, keeping your back straight.  Test the weight of unknown objects before attempting to lift them.  Try to keep your elbows locked down at your sides in order get the best strength from your shoulders when carrying an object.  Always ask for help when lifting heavy or awkward objects. INCORRECT LIFTING TECHNIQUES DO NOT:   Lock your knees when lifting, even if it is a small object.  Bend and twist. Pivot at your feet or move your feet when needing to change directions.  Assume that you cannot safely pick up a paperclip without proper posture.   This information is not intended to replace advice given to you by your health  care provider. Make sure you discuss any questions you have with your health care provider.   Document Released: 09/21/2005 Document Revised: 02/05/2015 Document Reviewed: 01/03/2009 [employer] Interactive Patient Education [employer]

## 2015-11-12 NOTE — Assessment & Plan Note (Signed)
Right hip pain of questionable origin between synovitis or bursitis with unlikely osteoarthritis given previous imaging. Start naproxen-esopmeprazole and diclofenac sodium. Recommend ice multiple times throughout the day as needed. Home exercise therapy initiated. If symptoms worsen or do not improve consider corticosteroid injection. Follow-up in 3 weeks.

## 2015-11-12 NOTE — Assessment & Plan Note (Signed)
Hypertension remains uncontrolled with current regimen and above goal 140/90. Appears compliant with medication regimen. Continue current dosage of hydrochlorothiazide and increase amlodipine-benazepril. Encouraged to decrease sodium intake. Encouraged to monitor blood pressure at home. Follow-up in one month to determine effectiveness.

## 2015-11-12 NOTE — Progress Notes (Signed)
Pre visit review using our clinic review tool, if applicable. No additional management support is needed unless otherwise documented below in the visit note. 

## 2015-11-12 NOTE — Progress Notes (Signed)
Subjective:    Patient ID: Patty Adams, female    DOB: [birth year], 55 y.o.   MRN: [id number]  Chief Complaint  Patient presents with  . Hip Pain    had an xray on her right hip and was told she has degenerative joint disease, still has pain that she is concerned about in her right hip    HPI:  Patty Adams is a 55 y.o. female who  has a past medical history of Hypertension; Hyperlipidemia; Chicken pox; and UTI (lower urinary tract infection). and presents today for a follow up office visit.   1.) Right hip pain - previously evaluated for right hip pain with concern for osteoarthritis. X-rays showed mild degenerative changes in the right hip. MRI of the right hip confirmed minimal dengenerative spurring of the right femoral head and acetabulum. Continues to experience pain located in the right hip and is worsening since previous visit. Modifying factors include OTC medication as needed for symptom relief which helped a little. Described as constant and is achy. Does now experience some back pain that that is located around the right SI joint. The pain primarily occurs when she is standing for long periods of time and is improved with her sitting. Denies trauma, however cannot rule out aggravation as she has been moving things the past few weeks.   2.) Hypertension - currently maintained on hydrochlorothiazide and amlodipine-benazepril. Takes the medication as prescribed with no adverse side effects or cough. Does not currently take her blood pressure at home, however notes during her office appointments her blood pressure continues to be elevating.  BP Readings from Last 3 Encounters:  11/12/15 154/100  03/27/15 190/118  01/10/15 170/118    No Known Allergies   Current Outpatient Prescriptions on File Prior to Visit  Medication Sig Dispense Refill  . Atorvastatin Calcium (LIPITOR PO) Take by mouth.    . hydrochlorothiazide (HYDRODIURIL) 25 MG tablet TAKE ONE TABLET BY MOUTH ONCE DAILY  90 tablet 3   No current facility-administered medications on file prior to visit.     Past Medical History  Diagnosis Date  . Hypertension   . Hyperlipidemia   . Chicken pox   . UTI (lower urinary tract infection)     Review of Systems  Eyes:       Negative for changes in vision.  Respiratory: Negative for chest tightness and shortness of breath.   Cardiovascular: Negative for chest pain, palpitations and leg swelling.  Musculoskeletal: Positive for back pain.       Positive for right hip pain.  Neurological: Negative for dizziness, weakness, numbness and headaches.      Objective:    BP 154/100 mmHg  Pulse 68  Temp(Src) 98 F (36.7 C) (Oral)  Resp 16  Ht  (1.651 m)  Wt 175 lb (79.379 kg)  BMI 29.12 kg/m2  SpO2 99% Nursing note and vital signs reviewed.  Physical Exam  Constitutional: She is oriented to person, place, and time. She appears well-developed and well-nourished. No distress.  Cardiovascular: Normal rate, regular rhythm, normal heart sounds and intact distal pulses.   Pulmonary/Chest: Effort normal and breath sounds normal.  Musculoskeletal:  Low back - no obvious deformity, discoloration, or edema noted. No palpable tenderness able to be elicited. Range of motion is normal in all directions with discomfort noted in left rotation and left lateral bending. Straight leg raise is negative. Faber's test is negative. Distal pulses, sensation, and reflexes are intact and appropriate.  Right hip -  no obvious deformity, discoloration, or edema noted mild tenderness elicited over the greater trochanter/greater trochanteric bursa. Hip range of motion within normal limits. Strength is 4-5+ in all directions. There is some discomfort with hip external rotation. Negative hip scarring. Distal pulses, sensation, and reflexes are intact and appropriate.  Neurological: She is alert and oriented to person, place, and time.  Skin: Skin is warm and dry.  Psychiatric: She  has a normal mood and affect. Her behavior is normal. Judgment and thought content normal.       Assessment & Plan:   Problem List Items Addressed This Visit      Cardiovascular and Mediastinum   Essential hypertension    Hypertension remains uncontrolled with current regimen and above goal 140/90. Appears compliant with medication regimen. Continue current dosage of hydrochlorothiazide and increase amlodipine-benazepril. Encouraged to decrease sodium intake. Encouraged to monitor blood pressure at home. Follow-up in one month to determine effectiveness.      Relevant Medications   amLODipine-benazepril (LOTREL) 10-40 MG capsule     Other   Right hip pain - Primary    Right hip pain of questionable origin between synovitis or bursitis with unlikely osteoarthritis given previous imaging. Start naproxen-esopmeprazole and diclofenac sodium. Recommend ice multiple times throughout the day as needed. Home exercise therapy initiated. If symptoms worsen or do not improve consider corticosteroid injection. Follow-up in 3 weeks.      Relevant Medications   Naproxen-Esomeprazole (VIMOVO) 500-20 MG TBEC   Diclofenac Sodium (PENNSAID) 2 % SOLN   Anxiety state   Relevant Medications   clonazePAM (KLONOPIN) 0.5 MG tablet   Low back pain    Low back pain most likely related to muscle imbalance. Residual concern for urinary tract infection. In office urinalysis negative for leukocytes, nitrites, or hematuria. Recommend conservative treatment with ice and home exercise therapy. Start naproxen-esomeprazole. Follow-up in 3 weeks or sooner if symptoms do not improve.      Relevant Medications   Naproxen-Esomeprazole (VIMOVO) 500-20 MG TBEC    Other Visit Diagnoses    Flank pain        Relevant Orders    POCT urinalysis dipstick (Completed)

## 2015-11-12 NOTE — Assessment & Plan Note (Addendum)
Low back pain most likely related to muscle imbalance. Residual concern for urinary tract infection. In office urinalysis negative for leukocytes, nitrites, or hematuria. Recommend conservative treatment with ice and home exercise therapy. Start naproxen-esomeprazole. Follow-up in 3 weeks or sooner if symptoms do not improve.

## 2015-12-09 ENCOUNTER — Ambulatory Visit (INDEPENDENT_AMBULATORY_CARE_PROVIDER_SITE_OTHER): Payer: 59 | Admitting: Family

## 2015-12-09 ENCOUNTER — Other Ambulatory Visit (INDEPENDENT_AMBULATORY_CARE_PROVIDER_SITE_OTHER): Payer: 59

## 2015-12-09 ENCOUNTER — Encounter: Payer: Self-pay | Admitting: Family

## 2015-12-09 VITALS — BP 134/94 | HR 67 | Temp 97.9°F | Resp 16 | Ht 65.0 in | Wt 177.0 lb

## 2015-12-09 DIAGNOSIS — Z23 Encounter for immunization: Secondary | ICD-10-CM | POA: Diagnosis not present

## 2015-12-09 DIAGNOSIS — Z0000 Encounter for general adult medical examination without abnormal findings: Secondary | ICD-10-CM | POA: Diagnosis not present

## 2015-12-09 DIAGNOSIS — Z0001 Encounter for general adult medical examination with abnormal findings: Secondary | ICD-10-CM | POA: Insufficient documentation

## 2015-12-09 LAB — COMPREHENSIVE METABOLIC PANEL
ALT: 12 U/L (ref 0–35)
AST: 15 U/L (ref 0–37)
Albumin: 4.7 g/dL (ref 3.5–5.2)
Alkaline Phosphatase: 58 U/L (ref 39–117)
BUN: 15 mg/dL (ref 6–23)
CO2: 28 mEq/L (ref 19–32)
Calcium: 9.7 mg/dL (ref 8.4–10.5)
Chloride: 102 mEq/L (ref 96–112)
Creatinine, Ser: 0.84 mg/dL (ref 0.40–1.20)
GFR: 90.69 mL/min (ref >60.00)
Glucose, Bld: 110 mg/dL — ABNORMAL HIGH (ref 70–99)
Potassium: 3.2 mEq/L — ABNORMAL LOW (ref 3.5–5.1)
Sodium: 140 mEq/L (ref 135–145)
Total Bilirubin: 0.5 mg/dL (ref 0.2–1.2)
Total Protein: 7.9 g/dL (ref 6.0–8.3)

## 2015-12-09 LAB — CBC
HEMATOCRIT: 38.1 % (ref 36.0–46.0)
HEMOGLOBIN: 12.9 g/dL (ref 12.0–15.0)
MCHC: 33.8 g/dL (ref 30.0–36.0)
MCV: 90.4 fl (ref 78.0–100.0)
Platelets: 242 10*3/uL (ref 150.0–400.0)
RBC: 4.21 Mil/uL (ref 3.87–5.11)
RDW: 13.9 % (ref 11.5–15.5)
WBC: 7.3 10*3/uL (ref 4.0–10.5)

## 2015-12-09 LAB — LIPID PANEL
Cholesterol: 250 mg/dL — ABNORMAL HIGH (ref 0–200)
HDL: 55.5 mg/dL (ref >39.00)
LDL Cholesterol: 166 mg/dL — ABNORMAL HIGH (ref 0–99)
NonHDL: 194.98
Total CHOL/HDL Ratio: 5
Triglycerides: 146 mg/dL (ref 0.0–149.0)
VLDL: 29.2 mg/dL (ref 0.0–40.0)

## 2015-12-09 LAB — TSH: TSH: 1.16 u[IU]/mL (ref 0.35–4.50)

## 2015-12-09 LAB — HEPATITIS C ANTIBODY: HCV Ab: NEGATIVE

## 2015-12-09 NOTE — Patient Instructions (Signed)
Thank you for choosing [facility].  Summary/Instructions:  Your prescription(s) have been submitted to your pharmacy or been printed and provided for you. Please take as directed and contact our office if you believe you are having problem(s) with the medication(s) or have any questions.  Please stop by the lab on the basement level of the building for your blood work. Your results will be released to MyChart (or called to you) after review, usually within 72 hours after test completion. If any changes need to be made, you will be notified at that same time.  Health Maintenance, Female Adopting a healthy lifestyle and getting preventive care can go a long way to promote health and wellness. Talk with your health care provider about what schedule of regular examinations is right for you. This is a good chance for you to check in with your provider about disease prevention and staying healthy. In between checkups, there are plenty of things you can do on your own. Experts have done a lot of research about which lifestyle changes and preventive measures are most likely to keep you healthy. Ask your health care provider for more information. WEIGHT AND DIET  Eat a healthy diet  Be sure to include plenty of vegetables, fruits, low-fat dairy products, and lean protein.  Do not eat a lot of foods high in solid fats, added sugars, or salt.  Get regular exercise. This is one of the most important things you can do for your health.  Most adults should exercise for at least 150 minutes each week. The exercise should increase your heart rate and make you sweat (moderate-intensity exercise).  Most adults should also do strengthening exercises at least twice a week. This is in addition to the moderate-intensity exercise.  Maintain a healthy weight  Body mass index (BMI) is a measurement that can be used to identify possible weight problems. It estimates body fat based on height and weight. Your  health care provider can help determine your BMI and help you achieve or maintain a healthy weight.  For females 20 years of age and older:   A BMI below 18.5 is considered underweight.  A BMI of 18.5 to 24.9 is normal.  A BMI of 25 to 29.9 is considered overweight.  A BMI of 30 and above is considered obese.  Watch levels of cholesterol and blood lipids  You should start having your blood tested for lipids and cholesterol at 55 years of age, then have this test every 5 years.  You may need to have your cholesterol levels checked more often if:  Your lipid or cholesterol levels are high.  You are older than 55 years of age.  You are at high risk for heart disease.  CANCER SCREENING   Lung Cancer  Lung cancer screening is recommended for adults 55-80 years old who are at high risk for lung cancer because of a history of smoking.  A yearly low-dose CT scan of the lungs is recommended for people who:  Currently smoke.  Have quit within the past 15 years.  Have at least a 30-pack-year history of smoking. A pack year is smoking an average of one pack of cigarettes a day for 1 year.  Yearly screening should continue until it has been 15 years since you quit.  Yearly screening should stop if you develop a health problem that would prevent you from having lung cancer treatment.  Breast Cancer  Practice breast self-awareness. This means understanding how your breasts normally   appear and feel.  It also means doing regular breast self-exams. Let your health care provider know about any changes, no matter how small.  If you are in your 20s or 30s, you should have a clinical breast exam (CBE) by a health care provider every 1-3 years as part of a regular health exam.  If you are 40 or older, have a CBE every year. Also consider having a breast X-ray (mammogram) every year.  If you have a family history of breast cancer, talk to your health care provider about genetic  screening.  If you are at high risk for breast cancer, talk to your health care provider about having an MRI and a mammogram every year.  Breast cancer gene (BRCA) assessment is recommended for women who have family members with BRCA-related cancers. BRCA-related cancers include:  Breast.  Ovarian.  Tubal.  Peritoneal cancers.  Results of the assessment will determine the need for genetic counseling and BRCA1 and BRCA2 testing. Cervical Cancer Your health care provider may recommend that you be screened regularly for cancer of the pelvic organs (ovaries, uterus, and vagina). This screening involves a pelvic examination, including checking for microscopic changes to the surface of your cervix (Pap test). You may be encouraged to have this screening done every 3 years, beginning at age 21.  For women ages 30-65, health care providers may recommend pelvic exams and Pap testing every 3 years, or they may recommend the Pap and pelvic exam, combined with testing for human papilloma virus (HPV), every 5 years. Some types of HPV increase your risk of cervical cancer. Testing for HPV may also be done on women of any age with unclear Pap test results.  Other health care providers may not recommend any screening for nonpregnant women who are considered low risk for pelvic cancer and who do not have symptoms. Ask your health care provider if a screening pelvic exam is right for you.  If you have had past treatment for cervical cancer or a condition that could lead to cancer, you need Pap tests and screening for cancer for at least 20 years after your treatment. If Pap tests have been discontinued, your risk factors (such as having a new sexual partner) need to be reassessed to determine if screening should resume. Some women have medical problems that increase the chance of getting cervical cancer. In these cases, your health care provider may recommend more frequent screening and Pap tests. Colorectal  Cancer  This type of cancer can be detected and often prevented.  Routine colorectal cancer screening usually begins at 55 years of age and continues through 55 years of age.  Your health care provider may recommend screening at an earlier age if you have risk factors for colon cancer.  Your health care provider may also recommend using home test kits to check for hidden blood in the stool.  A small camera at the end of a tube can be used to examine your colon directly (sigmoidoscopy or colonoscopy). This is done to check for the earliest forms of colorectal cancer.  Routine screening usually begins at age 50.  Direct examination of the colon should be repeated every 5-10 years through 55 years of age. However, you may need to be screened more often if early forms of precancerous polyps or small growths are found. Skin Cancer  Check your skin from head to toe regularly.  Tell your health care provider about any new moles or changes in moles, especially if there is   a change in a mole's shape or color.  Also tell your health care provider if you have a mole that is larger than the size of a pencil eraser.  Always use sunscreen. Apply sunscreen liberally and repeatedly throughout the day.  Protect yourself by wearing long sleeves, pants, a wide-brimmed hat, and sunglasses whenever you are outside. HEART DISEASE, DIABETES, AND HIGH BLOOD PRESSURE   High blood pressure causes heart disease and increases the risk of stroke. High blood pressure is more likely to develop in:  People who have blood pressure in the high end of the normal range (130-139/85-89 mm Hg).  People who are overweight or obese.  People who are African American.  If you are 18-39 years of age, have your blood pressure checked every 3-5 years. If you are 40 years of age or older, have your blood pressure checked every year. You should have your blood pressure measured twice--once when you are at a hospital or clinic,  and once when you are not at a hospital or clinic. Record the average of the two measurements. To check your blood pressure when you are not at a hospital or clinic, you can use:  An automated blood pressure machine at a pharmacy.  A home blood pressure monitor.  If you are between 55 years and 79 years old, ask your health care provider if you should take aspirin to prevent strokes.  Have regular diabetes screenings. This involves taking a blood sample to check your fasting blood sugar level.  If you are at a normal weight and have a low risk for diabetes, have this test once every three years after 55 years of age.  If you are overweight and have a high risk for diabetes, consider being tested at a younger age or more often. PREVENTING INFECTION  Hepatitis B  If you have a higher risk for hepatitis B, you should be screened for this virus. You are considered at high risk for hepatitis B if:  You were born in a country where hepatitis B is common. Ask your health care provider which countries are considered high risk.  Your parents were born in a high-risk country, and you have not been immunized against hepatitis B (hepatitis B vaccine).  You have HIV or AIDS.  You use needles to inject street drugs.  You live with someone who has hepatitis B.  You have had sex with someone who has hepatitis B.  You get hemodialysis treatment.  You take certain medicines for conditions, including cancer, organ transplantation, and autoimmune conditions. Hepatitis C  Blood testing is recommended for:  Everyone born from 1945 through 1965.  Anyone with known risk factors for hepatitis C. Sexually transmitted infections (STIs)  You should be screened for sexually transmitted infections (STIs) including gonorrhea and chlamydia if:  You are sexually active and are younger than 55 years of age.  You are older than 55 years of age and your health care provider tells you that you are at risk  for this type of infection.  Your sexual activity has changed since you were last screened and you are at an increased risk for chlamydia or gonorrhea. Ask your health care provider if you are at risk.  If you do not have HIV, but are at risk, it may be recommended that you take a prescription medicine daily to prevent HIV infection. This is called pre-exposure prophylaxis (PrEP). You are considered at risk if:  You are sexually active and do not regularly   use condoms or know the HIV status of your partner(s).  You take drugs by injection.  You are sexually active with a partner who has HIV. Talk with your health care provider about whether you are at high risk of being infected with HIV. If you choose to begin PrEP, you should first be tested for HIV. You should then be tested every 3 months for as long as you are taking PrEP.  PREGNANCY   If you are premenopausal and you may become pregnant, ask your health care provider about preconception counseling.  If you may become pregnant, take 400 to 800 micrograms (mcg) of folic acid every day.  If you want to prevent pregnancy, talk to your health care provider about birth control (contraception). OSTEOPOROSIS AND MENOPAUSE   Osteoporosis is a disease in which the bones lose minerals and strength with aging. This can result in serious bone fractures. Your risk for osteoporosis can be identified using a bone density scan.  If you are 65 years of age or older, or if you are at risk for osteoporosis and fractures, ask your health care provider if you should be screened.  Ask your health care provider whether you should take a calcium or vitamin D supplement to lower your risk for osteoporosis.  Menopause may have certain physical symptoms and risks.  Hormone replacement therapy may reduce some of these symptoms and risks. Talk to your health care provider about whether hormone replacement therapy is right for you.  HOME CARE INSTRUCTIONS    Schedule regular health, dental, and eye exams.  Stay current with your immunizations.   Do not use any tobacco products including cigarettes, chewing tobacco, or electronic cigarettes.  If you are pregnant, do not drink alcohol.  If you are breastfeeding, limit how much and how often you drink alcohol.  Limit alcohol intake to no more than 1 drink per day for nonpregnant women. One drink equals 12 ounces of beer, 5 ounces of wine, or 1 ounces of hard liquor.  Do not use street drugs.  Do not share needles.  Ask your health care provider for help if you need support or information about quitting drugs.  Tell your health care provider if you often feel depressed.  Tell your health care provider if you have ever been abused or do not feel safe at home.   This information is not intended to replace advice given to you by your health care provider. Make sure you discuss any questions you have with your health care provider.   Document Released: 04/06/2011 Document Revised: 10/12/2014 Document Reviewed: 08/23/2013 Elsevier Interactive Patient Education 2016 [employer]   

## 2015-12-09 NOTE — Assessment & Plan Note (Signed)
1) Anticipatory Guidance: Discussed importance of wearing a seatbelt while driving and not texting while driving; changing batteries in smoke detector at least once annually; wearing suntan lotion when outside; eating a balanced and moderate diet; getting physical activity at least 30 minutes per day.  2) Immunizations / Screenings / Labs:  Tetanus updated today. Declines influenza. All other immunizations are up-to-date per recommendations. Due for a Pap smear which will be scheduled independently. Due for a dental screen encouraged to be completed independently. Obtain hepatitis C antibody for hepatitis C screening. All other screenings are up-to-date per recommendations. Obtain CBC, CMET, Lipid profile and TSH.   Overall well exam with risk factors for cardiovascular disease including hypertension and overweight. Hypertension is labile and maintained on medication. Discussed importance of weight loss of 5-10% of current body weight through increasing physical activity to 30 minutes of moderate level activity daily. Encouraged a diet that is moderate, varied, and balanced and focused on nutrient dense foods and low in saturated fats and processed/sugary foods. Continue other healthy lifestyle behaviors and choices. Follow-up prevention exam in 1 year. Follow-up office visit pending blood work and for chronic conditions.

## 2015-12-09 NOTE — Progress Notes (Signed)
Pre visit review using our clinic review tool, if applicable. No additional management support is needed unless otherwise documented below in the visit note. 

## 2015-12-09 NOTE — Progress Notes (Signed)
Subjective:    Patient ID: Patty Adams, female    DOB: 07-28-1961, 55 y.o.   MRN: [id number]  Chief Complaint  Patient presents with  . CPE    Fasting, tetanus shot    HPI:  Patty Adams is a 55 y.o. female who presents today for an annual wellness visit.   1) Health Maintenance -   Diet - Averages about 3 meals per day consisting of fruits, vegetables, poultry, dairy and occasional meats. Caffeine about 1 cup daily.   Exercise - Minimal exercise; occasional walking 2x per week at best.    2) Preventative Exams / Immunizations:  Dental -- Due for exam   Vision -- Up to date    Health Maintenance  Topic Date Due  . Hepatitis C Screening  [id number]  . HIV Screening  05/27/1976  . TETANUS/TDAP  05/27/1980  . PAP SMEAR  05/27/1982  . INFLUENZA VACCINE  06/19/2016 (Originally 05/06/2015)  . MAMMOGRAM  10/31/2017  . COLONOSCOPY  05/06/2023    Immunization History  Administered Date(s) Administered  . Tdap 12/09/2015    No Known Allergies   Outpatient Prescriptions Prior to Visit  Medication Sig Dispense Refill  . amLODipine-benazepril (LOTREL) 10-40 MG capsule Take 1 capsule by mouth daily. 90 capsule 0  . Atorvastatin Calcium (LIPITOR PO) Take by mouth.    . clonazePAM (KLONOPIN) 0.5 MG tablet Take 1 tablet (0.5 mg total) by mouth 2 (two) times daily as needed for anxiety. 20 tablet 0  . Diclofenac Sodium (PENNSAID) 2 % SOLN Place 1 application onto the skin 2 (two) times daily as needed. 112 g 0  . hydrochlorothiazide (HYDRODIURIL) 25 MG tablet TAKE ONE TABLET BY MOUTH ONCE DAILY 90 tablet 3  . Naproxen-Esomeprazole (VIMOVO) 500-20 MG TBEC Take 1 tablet by mouth 2 (two) times daily as needed. 60 tablet 0   No facility-administered medications prior to visit.     Past Medical History  Diagnosis Date  . Hypertension   . Hyperlipidemia   . Chicken pox   . UTI (lower urinary tract infection)      Past Surgical History  Procedure Laterality Date  . Tubal  ligation       Family History  Problem Relation Age of Onset  . Hyperlipidemia Mother   . Hypertension Mother   . Hyperlipidemia Father   . Hypertension Father   . Diabetes Father   . Diabetes Maternal Grandmother   . Hypertension Maternal Grandfather   . Hypertension Paternal Grandmother   . Diabetes Paternal Grandmother      Social History   Social History  . Marital Status: Married    Spouse Name: N/A  . Number of Children: 3  . 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 Smoking status: Never Smoker   . Smokeless tobacco: Never Used  . Alcohol Use: No     Comment: occasionally  . Drug Use: No  . Sexual Activity: Not on file   Other Topics Concern  . Not on file   Social History Narrative   Fun: Paint, Travel   Denies any religious beliefs effecting health care.      Review of Systems  Constitutional: Denies fever, chills, fatigue, or significant weight gain/loss. HENT: Head: Denies headache or neck pain Ears: Denies changes in hearing, ringing in ears, earache, drainage Nose: Denies discharge, stuffiness, itching, nosebleed, sinus pain Throat: Denies sore throat, hoarseness, dry mouth, sores, thrush  Eyes: Denies loss/changes in vision, pain, redness, blurry/double vision, flashing lights Cardiovascular: Denies chest pain/discomfort, tightness, palpitations, shortness of breath with activity, difficulty lying down, swelling, sudden awakening with shortness of breath Respiratory: Denies shortness of breath, cough, sputum production, wheezing Gastrointestinal: Denies dysphasia, heartburn, change in appetite, nausea, change in bowel habits, rectal bleeding, constipation, diarrhea, yellow skin or eyes Genitourinary: Denies frequency, urgency, burning/pain, blood in urine, incontinence, change in urinary strength. Musculoskeletal: Denies muscle/joint pain, stiffness, back pain, redness or swelling of  joints, trauma Skin: Denies rashes, lumps, itching, dryness, color changes, or hair/nail changes Neurological: Denies dizziness, fainting, seizures, weakness, numbness, tingling, tremor Psychiatric - Denies nervousness, stress, depression or memory loss Endocrine: Denies heat or cold intolerance, sweating, frequent urination, excessive thirst, changes in appetite Hematologic: Denies ease of bruising or bleeding     Objective:    BP 134/94 mmHg  Pulse 67  Temp(Src) 97.9 F (36.6 C) (Oral)  Resp 16  Ht  (1.651 m)  Wt 177 lb (80.287 kg)  BMI 29.45 kg/m2  SpO2 98% Nursing note and vital signs reviewed.  Physical Exam  Constitutional: She is oriented to person, place, and time. She appears well-developed and well-nourished.  HENT:  Head: Normocephalic.  Right Ear: Hearing, tympanic membrane, external ear and ear canal normal.  Left Ear: Hearing, tympanic membrane, external ear and ear canal normal.  Nose: Nose normal.  Mouth/Throat: Uvula is midline, oropharynx is clear and moist and mucous membranes are normal.  Eyes: Conjunctivae and EOM are normal. Pupils are equal, round, and reactive to light.  Neck: Neck supple. No JVD present. No tracheal deviation present. No thyromegaly present.  Cardiovascular: Normal rate, regular rhythm, normal heart sounds and intact distal pulses.   Pulmonary/Chest: Effort normal and breath sounds normal.  Abdominal: Soft. Bowel sounds are normal. She exhibits no distension and no mass. There is no tenderness. There is no rebound and no guarding.  Musculoskeletal: Normal range of motion. She exhibits no edema or tenderness.  Lymphadenopathy:    She has no cervical adenopathy.  Neurological: She is alert and oriented to person, place, and time. She has normal reflexes. No cranial nerve deficit. She exhibits normal muscle tone. Coordination normal.  Skin: Skin is warm and dry.  Psychiatric: She has a normal mood and affect. Her behavior is normal.  Judgment and thought content normal.       Assessment & Plan:   Problem List Items Addressed This Visit      Other   Routine general medical examination at a health care facility - Primary    1) Anticipatory Guidance: Discussed importance of wearing a seatbelt while driving and not texting while driving; changing batteries in smoke detector at least once annually; wearing suntan lotion when outside; eating a balanced and moderate diet; getting physical activity at least 30 minutes per day.  2) Immunizations / Screenings / Labs:  Tetanus updated today. Declines influenza. All other immunizations are up-to-date per recommendations. Due for a Pap smear which will be scheduled independently. Due for a dental screen encouraged to be completed independently. Obtain hepatitis C antibody for hepatitis C screening. All other screenings are up-to-date per recommendations. Obtain CBC, CMET, Lipid profile and TSH.   Overall well exam with risk factors for cardiovascular disease including hypertension and overweight. Hypertension is labile and maintained on medication. Discussed importance of weight loss of 5-10% of current body weight through increasing physical activity to 30 minutes of moderate level activity daily. Encouraged a diet that is  moderate, varied, and balanced and focused on nutrient dense foods and low in saturated fats and processed/sugary foods. Continue other healthy lifestyle behaviors and choices. Follow-up prevention exam in 1 year. Follow-up office visit pending blood work and for chronic conditions.       Relevant Orders   CBC   Comprehensive metabolic panel   TSH   Lipid panel   Hepatitis C antibody    Other Visit Diagnoses    Need for Tdap vaccination        Relevant Orders    Tdap vaccine greater than or equal to 7yo IM (Completed)

## 2015-12-10 ENCOUNTER — Encounter: Payer: Self-pay | Admitting: Family

## 2016-02-12 ENCOUNTER — Other Ambulatory Visit: Payer: Self-pay | Admitting: Family

## 2016-02-17 ENCOUNTER — Ambulatory Visit (INDEPENDENT_AMBULATORY_CARE_PROVIDER_SITE_OTHER): Payer: 59 | Admitting: Family

## 2016-02-17 ENCOUNTER — Encounter: Payer: Self-pay | Admitting: Family

## 2016-02-17 ENCOUNTER — Telehealth: Payer: Self-pay | Admitting: Geriatric Medicine

## 2016-02-17 VITALS — BP 170/90 | HR 77 | Temp 98.7°F | Resp 14 | Ht 65.0 in | Wt 174.4 lb

## 2016-02-17 DIAGNOSIS — K0889 Other specified disorders of teeth and supporting structures: Secondary | ICD-10-CM | POA: Diagnosis not present

## 2016-02-17 MED ORDER — HYDROCODONE-ACETAMINOPHEN 5-325 MG PO TABS: 1.0000 | ORAL_TABLET | Freq: Every evening | ORAL | Status: DC | PRN

## 2016-02-17 MED ORDER — HYDROCHLOROTHIAZIDE 25 MG PO TABS: 25.0000 mg | ORAL_TABLET | Freq: Every day | ORAL | Status: DC

## 2016-02-17 MED ORDER — AMOXICILLIN 500 MG PO CAPS: 500.0000 mg | ORAL_CAPSULE | Freq: Two times a day (BID) | ORAL | Status: DC

## 2016-02-17 NOTE — Progress Notes (Signed)
Subjective:    Patient ID: Patty Adams, female    DOB: 1961-06-09, 55 y.o.   MRN: [id number]   Patty Adams is a 55 y.o. female who presents today for an acute visit.    HPI Comments: Patient here for evaluation of a right upper molar tooth and pain. No pain right now. Endorses right facial swelling, improving.Left upper incisor tooth broke 4 days ago. Takes tyelonol and ibuprofen with very relief. Rinsing with salt water. Concerned for infection until she can be seen by a dentist.   H/o dental abscess.  Blood pressure well controlled at home, especially since adding HCTZ. BP stays around 125/80. As been taken 6 date ibuprofen daily which she suspects is raised her blood pressure. Denies exertional chest pain or pressure, numbness or tingling radiating to left arm or jaw, palpitations, dizziness, frequent headaches, changes in vision, or shortness of breath.      Past Medical History  Diagnosis Date  . Hypertension   . Hyperlipidemia   . Chicken pox   . UTI (lower urinary tract infection)    Allergies: Review of patient's allergies indicates no known allergies. Current Outpatient Prescriptions on File Prior to Visit  Medication Sig Dispense Refill  . amLODipine-benazepril (LOTREL) 10-40 MG capsule TAKE ONE CAPSULE BY MOUTH ONCE DAILY 90 capsule 0  . clonazePAM (KLONOPIN) 0.5 MG tablet Take 1 tablet (0.5 mg total) by mouth 2 (two) times daily as needed for anxiety. 20 tablet 0  . Diclofenac Sodium (PENNSAID) 2 % SOLN Place 1 application onto the skin 2 (two) times daily as needed. 112 g 0  . hydrochlorothiazide (HYDRODIURIL) 25 MG tablet TAKE ONE TABLET BY MOUTH ONCE DAILY 90 tablet 3  . Naproxen-Esomeprazole (VIMOVO) 500-20 MG TBEC Take 1 tablet by mouth 2 (two) times daily as needed. 60 tablet 0  . Atorvastatin Calcium (LIPITOR PO) Take by mouth. Reported on 02/17/2016     No current facility-administered medications on file prior to visit.    Social History  Substance Use Topics    . Smoking status: Never Smoker   . Smokeless tobacco: Never Used  . Alcohol Use: No     Comment: occasionally    Review of Systems  Constitutional: Negative for fever and chills.  HENT: Negative for ear pain and sinus pressure.   Eyes: Negative for visual disturbance.  Cardiovascular: Negative for chest pain, palpitations and leg swelling.  Gastrointestinal: Negative for nausea and vomiting.  Neurological: Negative for dizziness, numbness and headaches.      Objective:    BP 170/90 mmHg  Pulse 77  Temp(Src) 98.7 F (37.1 C) (Oral)  Resp 14  Ht 5\' 5"  (1.651 m)  Wt 174 lb 6.4 oz (79.107 kg)  BMI 29.02 kg/m2  SpO2 98%   Physical Exam  Constitutional: She appears well-developed and well-nourished.  HENT:  Nose:    Mouth/Throat:    Broken tooth as noted on diagram. No discharge, swelling.  Mild tenderness noted left maxillary. No significant swelling.  Eyes: Conjunctivae are normal.  Cardiovascular: Normal rate, regular rhythm, normal heart sounds and normal pulses.   Pulmonary/Chest: Effort normal and breath sounds normal. She has no wheezes. She has no rhonchi. She has no rales.  Neurological: She is alert.  Skin: Skin is warm and dry.  Psychiatric: She has a normal mood and affect. Her speech is normal and behavior is normal. Thought content normal.  Vitals reviewed.      Assessment & Plan:  1. Toothache Broken tooth causing  pain and elevating blood pressure. No signs or symptoms of hypertensive emergency or urgency at this time. I also suspect the amount of Advil patient has taken has elevated blood pressure as she reports normal blood pressures at home in the absence of pain and Advil. Covering patient with amoxicillin due to history of abscess and left maxillary pain on exam. I looked up patient on  Controlled Substances Reporting System and saw no activity that raised concern of inappropriate use. Norco at bedtime as needed until patient can be seen by  Maurine Minister.  -Stop NSAIDs. Use tylenol as needed.  -Monitor BP.  - Ambulatory referral to Dentistry - HYDROcodone-acetaminophen (NORCO/VICODIN) 5-325 MG tablet; Take 1 tablet by mouth at bedtime as needed for moderate pain.  Dispense: 12 tablet; Refill: 0 - amoxicillin (AMOXIL) 500 MG capsule; Take 1 capsule (500 mg total) by mouth 2 (two) times daily.  Dispense: 14 capsule; Refill: 0    I am having [name] maintain her Atorvastatin Calcium (LIPITOR PO), hydrochlorothiazide, clonazePAM, Naproxen-Esomeprazole, Diclofenac Sodium, and amLODipine-benazepril.   No orders of the defined types were placed in this encounter.     Start medications as prescribed and explained to patient on After Visit Summary ( AVS). Risks, benefits, and alternatives of the medications and treatment plan prescribed today were discussed, and patient expressed understanding.   Education regarding symptom management and diagnosis given to patient.   Follow-up:Plan follow-up as discussed or as needed if any worsening symptoms or change in condition.   Continue to follow with Jeanine Luz, FNP for routine health maintenance.   Patty Adams and I agreed with plan.   Rennie Plowman, FNP

## 2016-02-17 NOTE — Progress Notes (Signed)
Pre visit review using our clinic review tool, if applicable. No additional management support is needed unless otherwise documented below in the visit note. 

## 2016-02-17 NOTE — Patient Instructions (Addendum)
Monitor BP. Stop Advil.   Referral to dentist.   If there is no improvement in your symptoms, or if there is any worsening of symptoms, or if you have any additional concerns, please return for re-evaluation; or, if we are closed, consider going to the Emergency Room for evaluation if symptoms urgent.

## 2016-02-17 NOTE — Telephone Encounter (Signed)
Medication refill sent .

## 2016-02-17 NOTE — Telephone Encounter (Signed)
Patty MurrainRhonda Adams would like a refill on her hctz. She is being seen here today by Patty PlowmanMargaret Adams.

## 2016-02-27 ENCOUNTER — Encounter: Payer: Self-pay | Admitting: Family

## 2016-02-27 ENCOUNTER — Ambulatory Visit (INDEPENDENT_AMBULATORY_CARE_PROVIDER_SITE_OTHER): Payer: 59 | Admitting: Family

## 2016-02-27 ENCOUNTER — Other Ambulatory Visit (INDEPENDENT_AMBULATORY_CARE_PROVIDER_SITE_OTHER): Payer: 59

## 2016-02-27 VITALS — BP 144/92 | HR 66 | Temp 98.0°F | Resp 16 | Ht 65.0 in | Wt 175.0 lb

## 2016-02-27 DIAGNOSIS — G5601 Carpal tunnel syndrome, right upper limb: Secondary | ICD-10-CM

## 2016-02-27 DIAGNOSIS — R221 Localized swelling, mass and lump, neck: Secondary | ICD-10-CM

## 2016-02-27 DIAGNOSIS — R0789 Other chest pain: Secondary | ICD-10-CM | POA: Diagnosis not present

## 2016-02-27 DIAGNOSIS — M542 Cervicalgia: Secondary | ICD-10-CM | POA: Insufficient documentation

## 2016-02-27 LAB — BASIC METABOLIC PANEL
BUN: 17 mg/dL (ref 6–23)
CALCIUM: 9.9 mg/dL (ref 8.4–10.5)
CHLORIDE: 103 meq/L (ref 96–112)
CO2: 33 meq/L — AB (ref 19–32)
Creatinine, Ser: 0.84 mg/dL (ref 0.40–1.20)
GFR: 90.61 mL/min (ref >60.00)
GLUCOSE: 84 mg/dL (ref 70–99)
Potassium: 3.4 mEq/L — ABNORMAL LOW (ref 3.5–5.1)
SODIUM: 141 meq/L (ref 135–145)

## 2016-02-27 NOTE — Progress Notes (Signed)
Pre visit review using our clinic review tool, if applicable. No additional management support is needed unless otherwise documented below in the visit note. 

## 2016-02-27 NOTE — Assessment & Plan Note (Signed)
Chest tightness most likely related to anxiety as in office EKG is within normal ranges with possible atrial enlargement. Unlikely this would be related to her current symptoms. We'll continue to monitor.

## 2016-02-27 NOTE — Progress Notes (Signed)
Subjective:    Patient ID: Patty Adams, female    DOB: 24-Dec-1960, 55 y.o.   MRN: [id number]  Chief Complaint  Patient presents with  . Neck lump    neck lump that she would like checked, twitches in right hand    HPI:  Patty Adams is a 55 y.o. female who  has a past medical history of Hypertension; Hyperlipidemia; Chicken pox; and UTI (lower urinary tract infection). and presents today for an office visit.   1.) Lump of neck - This is a new problem. Associated symptom of a lump located on her lower neck on the left side just above the sternoclavicular joint noted for about 1 week with symptom improvement since initial notice. There was no pain. No dysphasia or shortness of breath.   2.) Right hand - Associated symptom of twitching/tremors located in her right hand have been going on for about 7-8 months and describes a numbness in the median nerve distribution. 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 Described as achy on occasion. Modifying factors include Tylenol which may help some. She is right hand dominant. Notes the symptoms has remained about the same.  3.) Chest tightness - Associated symptom of tightness in her chest has been going on for about 1 month. Believes that it may be related to indigestion or possible stress as it waxes and wanes. Denies shortness of breath, chest pain, or dyspnea on exertion. Symptoms generally improve without treatment.    No Known Allergies   Current Outpatient Prescriptions on File Prior to Visit  Medication Sig Dispense Refill  . amLODipine-benazepril (LOTREL) 10-40 MG capsule TAKE ONE CAPSULE BY MOUTH ONCE DAILY 90 capsule 0  . Atorvastatin Calcium (LIPITOR PO) Take by mouth. Reported on 02/17/2016    . clonazePAM (KLONOPIN) 0.5 MG tablet Take 1 tablet (0.5 mg total) by mouth 2 (two) times daily as needed for anxiety. 20 tablet 0  . Diclofenac Sodium (PENNSAID) 2 % SOLN Place 1 application onto the skin 2 (two) times daily  as needed. 112 g 0  . hydrochlorothiazide (HYDRODIURIL) 25 MG tablet Take 1 tablet (25 mg total) by mouth daily. 90 tablet 3  . HYDROcodone-acetaminophen (NORCO/VICODIN) 5-325 MG tablet Take 1 tablet by mouth at bedtime as needed for moderate pain. 12 tablet 0  . Naproxen-Esomeprazole (VIMOVO) 500-20 MG TBEC Take 1 tablet by mouth 2 (two) times daily as needed. 60 tablet 0   No current facility-administered medications on file prior to visit.    Past Medical History  Diagnosis Date  . Hypertension   . Hyperlipidemia   . Chicken pox   . UTI (lower urinary tract infection)      Past Surgical History  Procedure Laterality Date  . Tubal ligation        Review of Systems  Constitutional: Negative for fever and chills.  Musculoskeletal: Negative for neck pain.  Neurological: Negative for dizziness, weakness and numbness.       Positive for right hand twitching      Objective:    BP 144/92 mmHg  Pulse 66  Temp(Src) 98 F (36.7 C) (Oral)  Resp 16  Ht  (1.651 m)  Wt 175 lb (79.379 kg)  BMI 29.12 kg/m2  SpO2 97% Nursing note and vital signs reviewed.  Physical Exam  Constitutional: She is oriented to person, place, and time. She appears well-developed and well-nourished. No distress.  Neck: Neck supple. No thyromegaly present.  Cardiovascular: Normal rate, regular  rhythm, normal heart sounds and intact distal pulses.   Pulmonary/Chest: Effort normal and breath sounds normal.  Musculoskeletal:  Right wrist - no obvious deformity, discoloration, or edema noted. No palpable tenderness able to be elicited. Range of motion is within normal limits. Strength is normal. Phalen's test is positive. Pulses and sensation are intact and appropriate.  Lymphadenopathy:    She has no cervical adenopathy.  Neurological: She is alert and oriented to person, place, and time.  Skin: Skin is warm and dry.  Psychiatric: She has a normal mood and affect. Her behavior is normal. Judgment  and thought content normal.       Assessment & Plan:   Problem List Items Addressed This Visit      Nervous and Auditory   Carpal tunnel syndrome of right wrist    Wrist numbness and tingling most likely related to carpal tunnel syndrome given repetitive office work. No twitching noted upon examination. Obtain basic metabolic profile to check potassium levels. Recommend cockup wrist splint, ice, and over-the-counter anti-inflammatories as needed for discomfort. Follow-up if symptoms worsen or do not improve.      Relevant Orders   Basic Metabolic Panel (BMET)     Other   Chest tightness - Primary    Chest tightness most likely related to anxiety as in office EKG is within normal ranges with possible atrial enlargement. Unlikely this would be related to her current symptoms. We'll continue to monitor.      Relevant Orders   EKG 12-Lead (Completed)   Localized swelling, mass or lump of neck    No significant masses or swelling noted upon examination. Most likely a lymph node that may have been enlarged for the time and is improved since. No further imaging necessary at this time. Continue to monitor and follow-up if symptoms return.          I have discontinued Ms. Bottari's amoxicillin. I am also having her maintain her Atorvastatin Calcium (LIPITOR PO), clonazePAM, Naproxen-Esomeprazole, Diclofenac Sodium, amLODipine-benazepril, HYDROcodone-acetaminophen, and hydrochlorothiazide.   Follow-up: No Follow-up on file.  Patty Adams, Gregory, FNP

## 2016-02-27 NOTE — Assessment & Plan Note (Signed)
Wrist numbness and tingling most likely related to carpal tunnel syndrome given repetitive office work. No twitching noted upon examination. Obtain basic metabolic profile to check potassium levels. Recommend cockup wrist splint, ice, and over-the-counter anti-inflammatories as needed for discomfort. Follow-up if symptoms worsen or do not improve.

## 2016-02-27 NOTE — Assessment & Plan Note (Signed)
No significant masses or swelling noted upon examination. Most likely a lymph node that may have been enlarged for the time and is improved since. No further imaging necessary at this time. Continue to monitor and follow-up if symptoms return.

## 2016-02-27 NOTE — Patient Instructions (Signed)
Thank you for choosing [facility].  Summary/Instructions:  Please continue to drink plenty of nonalcoholic/non-caffeinated beverages.  Goal for urine color to be clear to pale yellow.  For your wrist, try a cockup wrist splint and ice regularly.  Aleve or ibuprofen as needed for discomfort.   Please stop by the lab on the basement level of the building for your blood work. Your results will be released to MyChart (or called to you) after review, usually within 72 hours after test completion. If any changes need to be made, you will be notified at that same time.  If your symptoms worsen or fail to improve, please contact our office for further instruction, or in case of emergency go directly to the emergency room at the closest medical facility.

## 2016-05-13 ENCOUNTER — Other Ambulatory Visit: Payer: Self-pay | Admitting: Family

## 2016-08-05 ENCOUNTER — Other Ambulatory Visit: Payer: Self-pay | Admitting: Family

## 2016-08-05 DIAGNOSIS — F411 Generalized anxiety disorder: Secondary | ICD-10-CM

## 2016-08-05 NOTE — Telephone Encounter (Signed)
Last refill was 11/12/15.

## 2016-08-05 NOTE — Telephone Encounter (Signed)
Need office visit for additional refills 

## 2016-08-06 NOTE — Telephone Encounter (Signed)
Rx faxed

## 2016-08-17 ENCOUNTER — Encounter: Payer: Self-pay | Admitting: Family

## 2016-08-17 ENCOUNTER — Ambulatory Visit (INDEPENDENT_AMBULATORY_CARE_PROVIDER_SITE_OTHER): Payer: 59 | Admitting: Family

## 2016-08-17 VITALS — BP 148/98 | HR 72 | Temp 98.1°F | Resp 16 | Ht 65.0 in | Wt 178.0 lb

## 2016-08-17 DIAGNOSIS — M7711 Lateral epicondylitis, right elbow: Secondary | ICD-10-CM | POA: Diagnosis not present

## 2016-08-17 DIAGNOSIS — Z23 Encounter for immunization: Secondary | ICD-10-CM | POA: Diagnosis not present

## 2016-08-17 NOTE — Patient Instructions (Addendum)
Thank you for choosing [facility].  SUMMARY AND INSTRUCTIONS:  Ice massage x 10-15 minutes to the outside of the elbow every 2 hours as needed and after work/activity.  Check for proper alignment of your workspace.   Exercises and stretches 1-2 times per day.  Vimovo - 2x per day for the next 5 days and then as needed.  Pennsaid - 2x per day as needed.   Follow up:  If your symptoms worsen or fail to improve, please contact our office for further instruction, or in case of emergency go directly to the emergency room at the closest medical facility.     Lateral Epicondylitis With Rehab Lateral epicondylitis involves inflammation and pain around the outer portion of the elbow. The pain is caused by inflammation of the tendons in the forearm that bring back (extend) the wrist. Lateral epicondylitis is also called tennis elbow, because it is very common in tennis players. However, it may occur in any individual who extends the wrist repetitively. If lateral epicondylitis is left untreated, it may become a chronic problem. SYMPTOMS   Pain, tenderness, and inflammation on the outer (lateral) side of the elbow.  Pain or weakness with gripping activities.  Pain that increases with wrist-twisting motions (playing tennis, using a screwdriver, opening a door or a jar).  Pain with lifting objects, including a coffee cup. CAUSES  Lateral epicondylitis is caused by inflammation of the tendons that extend the wrist. Causes of injury may include:  Repetitive stress and strain on the muscles and tendons that extend the wrist.  Sudden change in activity level or intensity.  Incorrect grip in racquet sports.  Incorrect grip size of racquet (often too large).  Incorrect hitting position or technique (usually backhand, leading with the elbow).  Using a racket that is too heavy. RISK INCREASES WITH:  Sports or occupations that require repetitive and/or strenuous forearm and wrist  movements (tennis, squash, racquetball, carpentry).  Poor wrist and forearm strength and flexibility.  Failure to warm up properly before activity.  Resuming activity before healing, rehabilitation, and conditioning are complete. PREVENTION   Warm up and stretch properly before activity.  Maintain physical fitness:  Strength, flexibility, and endurance.  Cardiovascular fitness.  Wear and use properly fitted equipment.  Learn and use proper technique and have a coach correct improper technique.  Wear a tennis elbow (counterforce) brace. PROGNOSIS  The course of this condition depends on the degree of the injury. If treated properly, acute cases (symptoms lasting less than 4 weeks) are often resolved in 2 to 6 weeks. Chronic (longer lasting cases) often resolve in 3 to 6 months but may require physical therapy. RELATED COMPLICATIONS   Frequently recurring symptoms, resulting in a chronic problem. Properly treating the problem the first time decreases frequency of recurrence.  Chronic inflammation, scarring tendon degeneration, and partial tendon tear, requiring surgery.  Delayed healing or resolution of symptoms. TREATMENT  Treatment first involves the use of ice and medicine to reduce pain and inflammation. Strengthening and stretching exercises may help reduce discomfort if performed regularly. These exercises may be performed at home if the condition is an acute injury. Chronic cases may require a referral to a physical therapist for evaluation and treatment. Your caregiver may advise a corticosteroid injection to help reduce inflammation. Rarely, surgery is needed. MEDICATION  If pain medicine is needed, nonsteroidal anti-inflammatory medicines (aspirin and ibuprofen), or other minor pain relievers (acetaminophen), are often advised.  Do not take pain medicine for 7 days before surgery.  Prescription pain relievers may be given, if your caregiver thinks they are needed. Use  only as directed and only as much as you need.  Corticosteroid injections may be recommended. These injections should be reserved only for the most severe cases, because they can only be given a certain number of times. HEAT AND COLD  Cold treatment (icing) should be applied for 10 to 15 minutes every 2 to 3 hours for inflammation and pain, and immediately after activity that aggravates your symptoms. Use ice packs or an ice massage.  Heat treatment may be used before performing stretching and strengthening activities prescribed by your caregiver, physical therapist, or athletic trainer. Use a heat pack or a warm water soak. SEEK MEDICAL CARE IF: Symptoms get worse or do not improve in 2 weeks, despite treatment. EXERCISES  RANGE OF MOTION (ROM) AND STRETCHING EXERCISES - Epicondylitis, Lateral (Tennis Elbow) These exercises may help you when beginning to rehabilitate your injury. Your symptoms may go away with or without further involvement from your physician, physical therapist, or athletic trainer. While completing these exercises, remember:   Restoring tissue flexibility helps normal motion to return to the joints. This allows healthier, less painful movement and activity.  An effective stretch should be held for at least 30 seconds.  A stretch should never be painful. You should only feel a gentle lengthening or release in the stretched tissue. RANGE OF MOTION - Wrist Flexion, Active-Assisted  Extend your right / left elbow with your fingers pointing down.*  Gently pull the back of your hand towards you, until you feel a gentle stretch on the top of your forearm.  Hold this position for __________ seconds. Repeat __________ times. Complete this exercise __________ times per day.  *If directed by your physician, physical therapist or athletic trainer, complete this stretch with your elbow bent, rather than extended. RANGE OF MOTION - Wrist Extension, Active-Assisted  Extend your  right / left elbow and turn your palm upwards.*  Gently pull your palm and fingertips back, so your wrist extends and your fingers point more toward the ground.  You should feel a gentle stretch on the inside of your forearm.  Hold this position for __________ seconds. Repeat __________ times. Complete this exercise __________ times per day. *If directed by your physician, physical therapist or athletic trainer, complete this stretch with your elbow bent, rather than extended. STRETCH - Wrist Flexion  Place the back of your right / left hand on a tabletop, leaving your elbow slightly bent. Your fingers should point away from your body.  Gently press the back of your hand down onto the table by straightening your elbow. You should feel a stretch on the top of your forearm.  Hold this position for __________ seconds. Repeat __________ times. Complete this stretch __________ times per day.  STRETCH - Wrist Extension   Place your right / left fingertips on a tabletop, leaving your elbow slightly bent. Your fingers should point backwards.  Gently press your fingers and palm down onto the table by straightening your elbow. You should feel a stretch on the inside of your forearm.  Hold this position for __________ seconds. Repeat __________ times. Complete this stretch __________ times per day.  STRENGTHENING EXERCISES - Epicondylitis, Lateral (Tennis Elbow) These exercises may help you when beginning to rehabilitate your injury. They may resolve your symptoms with or without further involvement from your physician, physical therapist, or athletic trainer. While completing these exercises, remember:   Muscles can gain both the  endurance and the strength needed for everyday activities through controlled exercises.  Complete these exercises as instructed by your physician, physical therapist or athletic trainer. Increase the resistance and repetitions only as guided.  You may experience muscle  soreness or fatigue, but the pain or discomfort you are trying to eliminate should never worsen during these exercises. If this pain does get worse, stop and make sure you are following the directions exactly. If the pain is still present after adjustments, discontinue the exercise until you can discuss the trouble with your caregiver. STRENGTH - Wrist Flexors  Sit with your right / left forearm palm-up and fully supported on a table or countertop. Your elbow should be resting below the height of your shoulder. Allow your wrist to extend over the edge of the surface.  Loosely holding a __________ weight, or a piece of rubber exercise band or tubing, slowly curl your hand up toward your forearm.  Hold this position for __________ seconds. Slowly lower the wrist back to the starting position in a controlled manner. Repeat __________ times. Complete this exercise __________ times per day.  STRENGTH - Wrist Extensors  Sit with your right / left forearm palm-down and fully supported on a table or countertop. Your elbow should be resting below the height of your shoulder. Allow your wrist to extend over the edge of the surface.  Loosely holding a __________ weight, or a piece of rubber exercise band or tubing, slowly curl your hand up toward your forearm.  Hold this position for __________ seconds. Slowly lower the wrist back to the starting position in a controlled manner. Repeat __________ times. Complete this exercise __________ times per day.  STRENGTH - Ulnar Deviators  Stand with a ____________________ weight in your right / left hand, or sit while holding a rubber exercise band or tubing, with your healthy arm supported on a table or countertop.  Move your wrist, so that your pinkie travels toward your forearm and your thumb moves away from your forearm.  Hold this position for __________ seconds and then slowly lower the wrist back to the starting position. Repeat __________ times. Complete  this exercise __________ times per day STRENGTH - Radial Deviators  Stand with a ____________________ weight in your right / left hand, or sit while holding a rubber exercise band or tubing, with your injured arm supported on a table or countertop.  Raise your hand upward in front of you or pull up on the rubber tubing.  Hold this position for __________ seconds and then slowly lower the wrist back to the starting position. Repeat __________ times. Complete this exercise __________ times per day. STRENGTH - Forearm Supinators   Sit with your right / left forearm supported on a table, keeping your elbow below shoulder height. Rest your hand over the edge, palm down.  Gently grip a hammer or a soup ladle.  Without moving your elbow, slowly turn your palm and hand upward to a "thumbs-up" position.  Hold this position for __________ seconds. Slowly return to the starting position. Repeat __________ times. Complete this exercise __________ times per day.  STRENGTH - Forearm Pronators   Sit with your right / left forearm supported on a table, keeping your elbow below shoulder height. Rest your hand over the edge, palm up.  Gently grip a hammer or a soup ladle.  Without moving your elbow, slowly turn your palm and hand upward to a "thumbs-up" position.  Hold this position for __________ seconds. Slowly return to the  starting position. Repeat __________ times. Complete this exercise __________ times per day.  STRENGTH - Grip  Grasp a tennis ball, a dense sponge, or a large, rolled sock in your hand.  Squeeze as hard as you can, without increasing any pain.  Hold this position for __________ seconds. Release your grip slowly. Repeat __________ times. Complete this exercise __________ times per day.  STRENGTH - Elbow Extensors, Isometric  Stand or sit upright, on a firm surface. Place your right / left arm so that your palm faces your stomach, and it is at the height of your  waist.  Place your opposite hand on the underside of your forearm. Gently push up as your right / left arm resists. Push as hard as you can with both arms, without causing any pain or movement at your right / left elbow. Hold this stationary position for __________ seconds. Gradually release the tension in both arms. Allow your muscles to relax completely before repeating.   This information is not intended to replace advice given to you by your health care provider. Make sure you discuss any questions you have with your health care provider.   Document Released: 09/21/2005 Document Revised: 10/12/2014 Document Reviewed: 01/03/2009 Elsevier Interactive Patient Education [employer]

## 2016-08-17 NOTE — Assessment & Plan Note (Signed)
Symptoms and exam consistent with lateral epicondylitis most likely from repetitive motions at work and poor ergonomics. Recommend conservative treatment with ice massage, Vimovo, and Pennsaid. Start home exercise therapy. Tennis elbow strap as needed. Discussed seeking ergonomic assessment of ensure proper positioning to reduce further stressors.

## 2016-08-17 NOTE — Progress Notes (Signed)
Subjective:    Patient ID: Patty Adams, female    DOB: 02/06/1961, 55 y.o.   MRN: [id number]  Chief Complaint  Patient presents with  . Elbow Pain    x2 weeks has started having pain in right elbow, notices it more when working on the computer not as bad when she is not working    HPI:  Patty MurrainRhonda Stankiewicz is a 55 y.o. female who  has a past medical history of Chicken pox; Hyperlipidemia; Hypertension; and UTI (lower urinary tract infection). and presents today for an acute office visit.  This is a new problem. Associated symptom of pain located on the posterior and lateral aspect of her elbow that has been going on for about 2 weeks. Denies any trauma or injury that she can recall. Right hand dominant. Pain is described as achy . Modifying factors include Motrin which has helped a little. Aggravated when she works using a mouse and twisting motions. Course of the symptoms have improved slightly.    No Known Allergies    Outpatient Medications Prior to Visit  Medication Sig Dispense Refill  . amLODipine-benazepril (LOTREL) 10-40 MG capsule TAKE ONE CAPSULE BY MOUTH ONCE DAILY 90 capsule 2  . clonazePAM (KLONOPIN) 0.5 MG tablet TAKE ONE TABLET BY MOUTH TWICE DAILY AS NEEDED FOR ANXIETY 30 tablet 0  . Diclofenac Sodium (PENNSAID) 2 % SOLN Place 1 application onto the skin 2 (two) times daily as needed. 112 g 0  . hydrochlorothiazide (HYDRODIURIL) 25 MG tablet Take 1 tablet (25 mg total) by mouth daily. 90 tablet 3  . HYDROcodone-acetaminophen (NORCO/VICODIN) 5-325 MG tablet Take 1 tablet by mouth at bedtime as needed for moderate pain. 12 tablet 0  . Naproxen-Esomeprazole (VIMOVO) 500-20 MG TBEC Take 1 tablet by mouth 2 (two) times daily as needed. 60 tablet 0  . Atorvastatin Calcium (LIPITOR PO) Take by mouth. Reported on 02/17/2016     No facility-administered medications prior to visit.       Past Surgical History:  Procedure Laterality Date  . TUBAL LIGATION        Past Medical  History:  Diagnosis Date  . Chicken pox   . Hyperlipidemia   . Hypertension   . UTI (lower urinary tract infection)       Review of Systems  Constitutional: Negative for chills and fever.  Musculoskeletal:       Positive for right elbow pain.  Neurological: Positive for weakness and numbness.      Objective:    BP (!) 148/98 (BP Location: Left Arm, Patient Position: Sitting, Cuff Size: Normal)   Pulse 72   Temp 98.1 F (36.7 C) (Oral)   Resp 16   Ht 5\' 5"  (1.651 m)   Wt 178 lb (80.7 kg)   SpO2 97%   BMI 29.62 kg/m  Nursing note and vital signs reviewed.  Physical Exam  Constitutional: She is oriented to person, place, and time. She appears well-developed and well-nourished. No distress.  Cardiovascular: Normal rate, regular rhythm, normal heart sounds and intact distal pulses.   Pulmonary/Chest: Effort normal and breath sounds normal.  Musculoskeletal:  Right elbow - no obvious deformity or discoloration with mild edema around the lateral epicondyle. Tenderness elicited over lateral epicondyle, wrist extensor group and brachioradialis. Brachial radialis strength is normal with no discomfort. Wrist extension results in increased pain noted at the lateral epicondyle. Supination also causes discomfort. Pulses and sensation are intact and appropriate.  Neurological: She is alert and oriented to person, place,  and time.  Skin: Skin is warm and dry.  Psychiatric: She has a normal mood and affect. Her behavior is normal. Judgment and thought content normal.       Assessment & Plan:   Problem List Items Addressed This Visit      Musculoskeletal and Integument   Lateral epicondylitis of right elbow - Primary    Symptoms and exam consistent with lateral epicondylitis most likely from repetitive motions at work and poor ergonomics. Recommend conservative treatment with ice massage, Vimovo, and Pennsaid. Start home exercise therapy. Tennis elbow strap as needed. Discussed seeking  ergonomic assessment of ensure proper positioning to reduce further stressors.       Other Visit Diagnoses    Encounter for immunization       Relevant Orders   Flu Vaccine QUAD 36+ mos IM (Completed)       I have discontinued Ms. Orona's Atorvastatin Calcium (LIPITOR PO). I am also having her maintain her Naproxen-Esomeprazole, Diclofenac Sodium, HYDROcodone-acetaminophen, hydrochlorothiazide, amLODipine-benazepril, and clonazePAM.   Follow-up: Return in about 3 weeks (around 09/07/2016), or if symptoms worsen or fail to improve.  Jeanine Luzalone, Gregory, FNP

## 2016-12-03 ENCOUNTER — Telehealth: Payer: Self-pay | Admitting: Family

## 2016-12-03 NOTE — Telephone Encounter (Signed)
Patient requested mammogram and pap through my chart.  Left message for patient to call office back.  Need to see if she would like referrals for this.

## 2016-12-07 ENCOUNTER — Encounter: Payer: Self-pay | Admitting: Family

## 2016-12-07 ENCOUNTER — Ambulatory Visit (INDEPENDENT_AMBULATORY_CARE_PROVIDER_SITE_OTHER): Payer: 59 | Admitting: Family

## 2016-12-07 VITALS — BP 138/78 | HR 71 | Temp 98.4°F | Resp 16 | Ht 65.0 in | Wt 180.0 lb

## 2016-12-07 DIAGNOSIS — M7711 Lateral epicondylitis, right elbow: Secondary | ICD-10-CM | POA: Diagnosis not present

## 2016-12-07 DIAGNOSIS — M25561 Pain in right knee: Secondary | ICD-10-CM

## 2016-12-07 MED ORDER — PREDNISONE 20 MG PO TABS: ORAL_TABLET | ORAL | 0 refills | Status: DC

## 2016-12-07 MED ORDER — IBUPROFEN-FAMOTIDINE 800-26.6 MG PO TABS: 1.0000 | ORAL_TABLET | Freq: Three times a day (TID) | ORAL | 1 refills | Status: DC | PRN

## 2016-12-07 NOTE — Patient Instructions (Signed)
Thank you for choosing [facility].  SUMMARY AND INSTRUCTIONS:  Ice x 20 minutes every 2 hours and after activity / work and before bed.  Stretches and exercises daily.  Duexis 1 tablet by mouth 3 times daily for 5-7 days and then as needed.   Medication:  Your prescription(s) have been submitted to your pharmacy or been printed and provided for you. Please take as directed and contact our office if you believe you are having problem(s) with the medication(s) or have any questions.  Follow up:  If your symptoms worsen or fail to improve, please contact our office for further instruction, or in case of emergency go directly to the emergency room at the closest medical facility.    Tennis Elbow Rehab Ask your health care provider which exercises are safe for you. Do exercises exactly as told by your health care provider and adjust them as directed. It is normal to feel mild stretching, pulling, tightness, or discomfort as you do these exercises, but you should stop right away if you feel sudden pain or your pain gets worse. Do not begin these exercises until told by your health care provider. Stretching and range of motion exercises These exercises warm up your muscles and joints and improve the movement and flexibility of your elbow. These exercises also help to relieve pain, numbness, and tingling. Exercise A: Wrist extensor stretch  1. Extend your left / right elbow with your fingers pointing down. 2. Gently pull the palm of your left / right hand toward you until you feel a gentle stretch on the top of your forearm. 3. To increase the stretch, push your left / right hand toward the outer edge or pinkie side of your forearm. 4. Hold this position for __________ seconds. Repeat __________ times. Complete this exercise __________ times a day. If directed by your health care provider, repeat this stretch except do it with a bent elbow this time. Exercise B: Wrist flexor stretch    1. Extend your left / right elbow and turn your palm upward. 2. Gently pull your left / right palm and fingertips back so your wrist extends and your fingers point more toward the ground. 3. You should feel a gentle stretch on the inside of your forearm. 4. Hold this position for __________ seconds. Repeat __________ times. Complete this exercise __________ times a day. If directed by your health care provider, repeat this stretch except do it with a bent elbow this time. Strengthening exercises These exercises build strength and endurance in your elbow. Endurance is the ability to use your muscles for a long time, even after they get tired. Exercise C: Wrist extensors   1. Sit with your left / right forearm palm-down and fully supported on a table or countertop. Your elbow should be resting below the height of your shoulder. 2. Let your left / right wrist extend over the edge of the surface. 3. Loosely hold a __________ weight or a piece of rubber exercise band or tubing in your left / right hand. Slowly curl your left / right hand up toward your forearm. If you are using band or tubing, hold the band or tubing in place with your other hand to provide resistance. 4. Hold this position for __________ seconds. 5. Slowly return to the starting position. Repeat __________ times. Complete this exercise __________ times a day. Exercise D: Radial deviators   1. Stand with a __________ weight in your left / righthand. Or, sit while holding a rubber  exercise band or tubing with your other arm supported on a table or countertop. Position your hand so your thumb is on top. 2. Raise your hand upward in front of you so your thumb travels toward your forearm, or pull up on the rubber tubing. 3. Hold this position for __________ seconds. 4. Slowly return to the starting position. Repeat __________ times. Complete this exercise __________ times a day. Exercise E: Eccentric wrist extensors  1. Sit with  your left / right forearm palm-down and fully supported on a table or countertop. Your elbow should be resting below the height of your shoulder. 2. If told by your health care provider, hold a __________ weight in your hand. 3. Let your left / right wrist extend over the edge of the surface. 4. Use your other hand to lift up your left / right hand toward your forearm. Keep your forearm on the table. 5. Using only the muscles in your left / right hand, slowly lower your hand back down to the starting position. Repeat __________ times. Complete this exercise __________ times a day. This information is not intended to replace advice given to you by your health care provider. Make sure you discuss any questions you have with your health care provider. Document Released: 09/21/2005 Document Revised: 05/27/2016 Document Reviewed: 06/20/2015 Elsevier Interactive Patient Education  2017 [employer]   Gluteus Medius Syndrome Rehab Ask your health care provider which exercises are safe for you. Do exercises exactly as told by your health care provider and adjust them as directed. It is normal to feel mild stretching, pulling, tightness, or discomfort as you do these exercises, but you should stop right away if you feel sudden pain or your pain gets worse. Do not begin these exercises until told by your health care provider. Stretching and range of motion exercise This exercise warms up your muscles and joints and improves the movement and flexibility of your hip and pelvis. This exercise also helps to relieve pain and stiffness. Exercise A: Lunge (  hip flexor stretch) 5. Kneel on the floor on your left / right knee. Bend your other knee so it is directly over your ankle. 6. Keep good posture with your head over your shoulders. Tuck your tailbone underneath you. This will prevent your back from arching too much. 7. You should feel a gentle stretch in the front of your thigh or hip. If you do not feel a  stretch, slowly lunge forward with your chest up. 8. Hold this position for __________ seconds. 9. Slowly return to the starting position. Repeat __________ times. Complete this exercise __________ times a day. Strengthening exercises These exercises build strength and endurance in your hip and pelvis. Endurance is the ability to use your muscles for a long time, even after they get tired. Exercise B: Bridge ( hip extensors) 1. Lie on your back on a firm surface with your knees bent and your feet flat on the floor. 2. Tighten your buttocks muscles and lift your bottom off the floor until the trunk of your body is level with your thighs.  You should feel the muscles working in your buttocks and the back of your thighs. If this exercise is too easy, cross your arms over your chest or lift one leg while your bottom is up off the floor.  Do not arch your back. 3. Hold this position for __________ seconds. 4. Slowly lower your hips to the starting position. 5. Let your muscles relax completely between repetitions.  Repeat __________ times. Complete this exercise __________ times a day. Exercise C: Straight leg raises ( hip abductors) 6. Lie on your side with your left / right leg in the top position. Lie so your head, shoulder, knee, and hip line up. Bend your bottom knee to help you balance. 7. Lift your top leg up 4-6 inches (10-15 cm), keeping your toes pointed straight ahead. 8. Hold this position for __________ seconds. 9. Slowly lower your leg to the starting position and let your muscles relax completely. Repeat __________ times. Complete this exercise __________ times a day. Exercise D: Hip abductors and external rotators, quadruped 5. Get on your hands and knees on a firm, lightly padded surface. Your hands should be directly below your shoulders, and your knees should be directly below your hips. 6. Lift your left / right knee out to the side. Keep your knee bent. Do not twist your  body. 7. Hold this position for __________ seconds. 8. Slowly lower your leg. Repeat __________ times. Complete this exercise __________ times a day. Exercise E: Single leg stand 6. Stand near a counter or door frame to hold onto as needed. It is helpful to look in a mirror for this exercise so you can watch your hip. 7. Squeeze your left / right buttock muscles then lift up your other foot. Do not let your left / righthip push out to the side. 8. Hold this position for __________ seconds. Repeat __________ times. Complete this exercise __________ times a day. This information is not intended to replace advice given to you by your health care provider. Make sure you discuss any questions you have with your health care provider. Document Released: 09/21/2005 Document Revised: 05/28/2016 Document Reviewed: 09/03/2015 Elsevier Interactive Patient Education  2017 [employer].

## 2016-12-07 NOTE — Assessment & Plan Note (Signed)
Symptoms remain consistent with lateral epicondylitis that is refractory to conservative treatment. Start prednisone. Continue with icing regimen and home exercise therapy. Discontinue Vimovo and start Duexis. Consider referral to physical therapy or possible cortisone injection. Follow up if symptoms worsen or do not improve.

## 2016-12-07 NOTE — Assessment & Plan Note (Signed)
Right medial knee pain with no significant trauma in the setting of weak core stabilizers. Ligamentous and meniscal testing is negative. Treat conservatively with prednisone and icing regimen. Glut medius strengthening. Consider x-rays if symptoms worsen or do not improve.

## 2016-12-07 NOTE — Progress Notes (Signed)
Subjective:    Patient ID: Patty Adams, female    DOB: 06-17-1961, 56 y.o.   MRN: [id number]  Chief Complaint  Patient presents with  . Knee Pain    right knee pain and still having right elbow pain     HPI:  Patty Adams is a 56 y.o. female who  has a past medical history of Chicken pox; Hyperlipidemia; Hypertension; and UTI (lower urinary tract infection). and presents today for a follow up office visit.   1.) Lateral epicondylitis - Continues to experience the associated symptoms of pain located at the right epicondyle of her right elbow that generally waxes and wanes. There has been some improvements and she notes that Duexis tends to work a little better than the Vimovo. Does have a burning sensation at times.   2.) Knee pain - This is a new problem. Associated symptom of pain located in her right knee has been going on for about 1 month with a severity that at times that she can barely walk. Denies any trauma or injury. Describes that she does hear her knee popping on occasion. Worse with getting up and lessons when she is walking. Denies any numbness or tingling. Unable to stoop.    No Known Allergies   Outpatient Medications Prior to Visit  Medication Sig Dispense Refill  . amLODipine-benazepril (LOTREL) 10-40 MG capsule TAKE ONE CAPSULE BY MOUTH ONCE DAILY 90 capsule 2  . clonazePAM (KLONOPIN) 0.5 MG tablet TAKE ONE TABLET BY MOUTH TWICE DAILY AS NEEDED FOR ANXIETY 30 tablet 0  . hydrochlorothiazide (HYDRODIURIL) 25 MG tablet Take 1 tablet (25 mg total) by mouth daily. 90 tablet 3  . Diclofenac Sodium (PENNSAID) 2 % SOLN Place 1 application onto the skin 2 (two) times daily as needed. 112 g 0  . HYDROcodone-acetaminophen (NORCO/VICODIN) 5-325 MG tablet Take 1 tablet by mouth at bedtime as needed for moderate pain. 12 tablet 0  . Naproxen-Esomeprazole (VIMOVO) 500-20 MG TBEC Take 1 tablet by mouth 2 (two) times daily as needed. 60 tablet 0   No facility-administered  medications prior to visit.       Past Surgical History:  Procedure Laterality Date  . TUBAL LIGATION        Past Medical History:  Diagnosis Date  . Chicken pox   . Hyperlipidemia   . Hypertension   . UTI (lower urinary tract infection)       Review of Systems  Constitutional: Negative for chills and fever.  Musculoskeletal:       Positive for knee and elbow pain.   Neurological: Negative for weakness and numbness.      Objective:    BP 138/78 (BP Location: Left Arm, Patient Position: Sitting, Cuff Size: Normal)   Pulse 71   Temp 98.4 F (36.9 C) (Oral)   Resp 16   Ht 5\' 5"  (1.651 m)   Wt 180 lb (81.6 kg)   SpO2 96%   BMI 29.95 kg/m  Nursing note and vital signs reviewed.  Physical Exam  Constitutional: She is oriented to person, place, and time. She appears well-developed and well-nourished. No distress.  Cardiovascular: Normal rate, regular rhythm, normal heart sounds and intact distal pulses.   Pulmonary/Chest: Effort normal and breath sounds normal.  Musculoskeletal:  Right elbow - no obvious deformity, discoloration, or edema. Palpable tenderness over lateral condyle. No crepitus or deformity noted. Range of motion of wrists and elbows are within normal limits. Distal pulses and sensation are intact and appropriate.  Right  knee - no obvious deformity, discoloration, or edema. Palpable tenderness along medial joint line with no crepitus or deformity. Range of motion within normal limits. Strength is normal. Ligamentous and meniscal testing are negative. Distal pulses and sensation are intact and appropriate.  Neurological: She is alert and oriented to person, place, and time.  Skin: Skin is warm and dry.  Psychiatric: She has a normal mood and affect. Her behavior is normal. Judgment and thought content normal.       Assessment & Plan:   Problem List Items Addressed This Visit      Musculoskeletal and Integument   Lateral epicondylitis of right elbow  - Primary    Symptoms remain consistent with lateral epicondylitis that is refractory to conservative treatment. Start prednisone. Continue with icing regimen and home exercise therapy. Discontinue Vimovo and start Duexis. Consider referral to physical therapy or possible cortisone injection. Follow up if symptoms worsen or do not improve.       Relevant Medications   predniSONE (DELTASONE) 20 MG tablet   Ibuprofen-Famotidine 800-26.6 MG TABS     Other   Right medial knee pain    Right medial knee pain with no significant trauma in the setting of weak core stabilizers. Ligamentous and meniscal testing is negative. Treat conservatively with prednisone and icing regimen. Glut medius strengthening. Consider x-rays if symptoms worsen or do not improve.       Relevant Medications   predniSONE (DELTASONE) 20 MG tablet   Ibuprofen-Famotidine 800-26.6 MG TABS       I have discontinued Patty Adams's Naproxen-Esomeprazole, Diclofenac Sodium, and HYDROcodone-acetaminophen. I am also having her start on predniSONE and Ibuprofen-Famotidine. Additionally, I am having her maintain her hydrochlorothiazide, amLODipine-benazepril, and clonazePAM.   Meds ordered this encounter  Medications  . predniSONE (DELTASONE) 20 MG tablet    Sig: Take 3 tablets by mouth for 3 days, 2 tablets by mouth for 3 days and 1 tablet by mouth for 3 days.    Dispense:  18 tablet    Refill:  0    Order Specific Question:   Supervising Provider    Answer:   Hillard DankerRAWFORD, ELIZABETH A [[id number]]  . Ibuprofen-Famotidine 800-26.6 MG TABS    Sig: Take 1 tablet by mouth 3 (three) times daily as needed.    Dispense:  90 tablet    Refill:  1    Order Specific Question:   Supervising Provider    Answer:   Hillard DankerRAWFORD, ELIZABETH A [[id number]]     Follow-up: Return in about 1 month (around 01/07/2017), or if symptoms worsen or fail to improve.  Patty Adams, Patty Thede, FNP

## 2017-01-27 ENCOUNTER — Other Ambulatory Visit: Payer: Self-pay | Admitting: Family

## 2017-03-11 ENCOUNTER — Other Ambulatory Visit: Payer: Self-pay | Admitting: Family

## 2017-03-24 DIAGNOSIS — Z719 Counseling, unspecified: Secondary | ICD-10-CM | POA: Diagnosis not present

## 2017-04-13 DIAGNOSIS — Z1231 Encounter for screening mammogram for malignant neoplasm of breast: Secondary | ICD-10-CM | POA: Diagnosis not present

## 2017-04-13 LAB — HM MAMMOGRAPHY

## 2017-04-14 ENCOUNTER — Encounter: Payer: Self-pay | Admitting: Family

## 2017-05-05 ENCOUNTER — Encounter: Payer: Self-pay | Admitting: Family

## 2017-05-05 ENCOUNTER — Ambulatory Visit (INDEPENDENT_AMBULATORY_CARE_PROVIDER_SITE_OTHER): Payer: 59 | Admitting: Family

## 2017-05-05 ENCOUNTER — Other Ambulatory Visit (INDEPENDENT_AMBULATORY_CARE_PROVIDER_SITE_OTHER): Payer: 59

## 2017-05-05 VITALS — BP 150/94 | HR 69 | Temp 98.3°F | Resp 16 | Ht 65.0 in | Wt 180.1 lb

## 2017-05-05 DIAGNOSIS — Z124 Encounter for screening for malignant neoplasm of cervix: Secondary | ICD-10-CM

## 2017-05-05 DIAGNOSIS — Z0001 Encounter for general adult medical examination with abnormal findings: Secondary | ICD-10-CM

## 2017-05-05 DIAGNOSIS — M25551 Pain in right hip: Secondary | ICD-10-CM | POA: Insufficient documentation

## 2017-05-05 DIAGNOSIS — M25552 Pain in left hip: Secondary | ICD-10-CM | POA: Diagnosis not present

## 2017-05-05 LAB — CBC
HCT: 38.1 % (ref 36.0–46.0)
Hemoglobin: 12.8 g/dL (ref 12.0–15.0)
MCHC: 33.5 g/dL (ref 30.0–36.0)
MCV: 92.2 fl (ref 78.0–100.0)
Platelets: 256 10*3/uL (ref 150.0–400.0)
RBC: 4.13 Mil/uL (ref 3.87–5.11)
RDW: 13.5 % (ref 11.5–15.5)
WBC: 6.9 10*3/uL (ref 4.0–10.5)

## 2017-05-05 LAB — HIV ANTIBODY (ROUTINE TESTING W REFLEX): HIV: NONREACTIVE

## 2017-05-05 LAB — COMPREHENSIVE METABOLIC PANEL
ALT: 10 U/L (ref 0–35)
AST: 13 U/L (ref 0–37)
Albumin: 4.4 g/dL (ref 3.5–5.2)
Alkaline Phosphatase: 54 U/L (ref 39–117)
BUN: 14 mg/dL (ref 6–23)
CO2: 30 mEq/L (ref 19–32)
Calcium: 9.3 mg/dL (ref 8.4–10.5)
Chloride: 101 mEq/L (ref 96–112)
Creatinine, Ser: 0.82 mg/dL (ref 0.40–1.20)
GFR: 92.76 mL/min (ref >60.00)
GLUCOSE: 114 mg/dL — AB (ref 70–99)
POTASSIUM: 3.1 meq/L — AB (ref 3.5–5.1)
Sodium: 140 mEq/L (ref 135–145)
Total Bilirubin: 0.4 mg/dL (ref 0.2–1.2)
Total Protein: 7.6 g/dL (ref 6.0–8.3)

## 2017-05-05 LAB — LIPID PANEL
CHOL/HDL RATIO: 5
Cholesterol: 242 mg/dL — ABNORMAL HIGH (ref 0–200)
HDL: 53.1 mg/dL (ref >39.00)
LDL Cholesterol: 163 mg/dL — ABNORMAL HIGH (ref 0–99)
NONHDL: 189.13
Triglycerides: 129 mg/dL (ref 0.0–149.0)
VLDL: 25.8 mg/dL (ref 0.0–40.0)

## 2017-05-05 NOTE — Patient Instructions (Signed)
Thank you for choosing [facility].  SUMMARY AND INSTRUCTIONS:  Please continue to take your medications as prescribed.   Ice/moist heat for your back and hips.   Continue with Duexis.  If your symptoms worsen consider physical therapy.  Recommend a Mediterranian style intake as below.  Work on increasing physical activity.  Monitor your blood pressure at home and follow a low sodium diet.   Medication:  Your prescription(s) have been submitted to your pharmacy or been printed and provided for you. Please take as directed and contact our office if you believe you are having problem(s) with the medication(s) or have any questions.  Labs:  Please stop by the lab on the lower level of the building for your blood work. Your results will be released to [place] (or called to you) after review, usually within 72 hours after test completion. If any changes need to be made, you will be notified at that same time.  1.) The lab is open from 7:30am to 5:30 pm Monday-Friday 2.) No appointment is necessary 3.) Fasting (if needed) is 6-8 hours after food and drink; black coffee and water are okay   Follow up:  If your symptoms worsen or fail to improve, please contact our office for further instruction, or in case of emergency go directly to the emergency room at the closest medical facility.    Health Maintenance, Female Adopting a healthy lifestyle and getting preventive care can go a long way to promote health and wellness. Talk with your health care provider about what schedule of regular examinations is right for you. This is a good chance for you to check in with your provider about disease prevention and staying healthy. In between checkups, there are plenty of things you can do on your own. Experts have done a lot of research about which lifestyle changes and preventive measures are most likely to keep you healthy. Ask your health care provider for more information. Weight and  diet Eat a healthy diet  Be sure to include plenty of vegetables, fruits, low-fat dairy products, and lean protein.  Do not eat a lot of foods high in solid fats, added sugars, or salt.  Get regular exercise. This is one of the most important things you can do for your health. ? Most adults should exercise for at least 150 minutes each week. The exercise should increase your heart rate and make you sweat (moderate-intensity exercise). ? Most adults should also do strengthening exercises at least twice a week. This is in addition to the moderate-intensity exercise.  Maintain a healthy weight  Body mass index (BMI) is a measurement that can be used to identify possible weight problems. It estimates body fat based on height and weight. Your health care provider can help determine your BMI and help you achieve or maintain a healthy weight.  For females 80 years of age and older: ? A BMI below 18.5 is considered underweight. ? A BMI of 18.5 to 24.9 is normal. ? A BMI of 25 to 29.9 is considered overweight. ? A BMI of 30 and above is considered obese.  Watch levels of cholesterol and blood lipids  You should start having your blood tested for lipids and cholesterol at 56 years of age, then have this test every 5 years.  You may need to have your cholesterol levels checked more often if: ? Your lipid or cholesterol levels are high. ? You are older than 56 years of age. ? You are at high  risk for heart disease.  Cancer screening Lung Cancer  Lung cancer screening is recommended for adults 49-44 years old who are at high risk for lung cancer because of a history of smoking.  A yearly low-dose CT scan of the lungs is recommended for people who: ? Currently smoke. ? Have quit within the past 15 years. ? Have at least a 30-pack-year history of smoking. A pack year is smoking an average of one pack of cigarettes a day for 1 year.  Yearly screening should continue until it has been 15 years  since you quit.  Yearly screening should stop if you develop a health problem that would prevent you from having lung cancer treatment.  Breast Cancer  Practice breast self-awareness. This means understanding how your breasts normally appear and feel.  It also means doing regular breast self-exams. Let your health care provider know about any changes, no matter how small.  If you are in your 20s or 30s, you should have a clinical breast exam (CBE) by a health care provider every 1-3 years as part of a regular health exam.  If you are 74 or older, have a CBE every year. Also consider having a breast X-ray (mammogram) every year.  If you have a family history of breast cancer, talk to your health care provider about genetic screening.  If you are at high risk for breast cancer, talk to your health care provider about having an MRI and a mammogram every year.  Breast cancer gene (BRCA) assessment is recommended for women who have family members with BRCA-related cancers. BRCA-related cancers include: ? Breast. ? Ovarian. ? Tubal. ? Peritoneal cancers.  Results of the assessment will determine the need for genetic counseling and BRCA1 and BRCA2 testing.  Cervical Cancer Your health care provider may recommend that you be screened regularly for cancer of the pelvic organs (ovaries, uterus, and vagina). This screening involves a pelvic examination, including checking for microscopic changes to the surface of your cervix (Pap test). You may be encouraged to have this screening done every 3 years, beginning at age 57.  For women ages 28-65, health care providers may recommend pelvic exams and Pap testing every 3 years, or they may recommend the Pap and pelvic exam, combined with testing for human papilloma virus (HPV), every 5 years. Some types of HPV increase your risk of cervical cancer. Testing for HPV may also be done on women of any age with unclear Pap test results.  Other health care  providers may not recommend any screening for nonpregnant women who are considered low risk for pelvic cancer and who do not have symptoms. Ask your health care provider if a screening pelvic exam is right for you.  If you have had past treatment for cervical cancer or a condition that could lead to cancer, you need Pap tests and screening for cancer for at least 20 years after your treatment. If Pap tests have been discontinued, your risk factors (such as having a new sexual partner) need to be reassessed to determine if screening should resume. Some women have medical problems that increase the chance of getting cervical cancer. In these cases, your health care provider may recommend more frequent screening and Pap tests.  Colorectal Cancer  This type of cancer can be detected and often prevented.  Routine colorectal cancer screening usually begins at 56 years of age and continues through 56 years of age.  Your health care provider may recommend screening at an earlier age  if you have risk factors for colon cancer.  Your health care provider may also recommend using home test kits to check for hidden blood in the stool.  A small camera at the end of a tube can be used to examine your colon directly (sigmoidoscopy or colonoscopy). This is done to check for the earliest forms of colorectal cancer.  Routine screening usually begins at age 51.  Direct examination of the colon should be repeated every 5-10 years through 56 years of age. However, you may need to be screened more often if early forms of precancerous polyps or small growths are found.  Skin Cancer  Check your skin from head to toe regularly.  Tell your health care provider about any new moles or changes in moles, especially if there is a change in a mole's shape or color.  Also tell your health care provider if you have a mole that is larger than the size of a pencil eraser.  Always use sunscreen. Apply sunscreen liberally and  repeatedly throughout the day.  Protect yourself by wearing long sleeves, pants, a wide-brimmed hat, and sunglasses whenever you are outside.  Heart disease, diabetes, and high blood pressure  High blood pressure causes heart disease and increases the risk of stroke. High blood pressure is more likely to develop in: ? People who have blood pressure in the high end of the normal range (130-139/85-89 mm Hg). ? People who are overweight or obese. ? People who are African American.  If you are 47-71 years of age, have your blood pressure checked every 3-5 years. If you are 60 years of age or older, have your blood pressure checked every year. You should have your blood pressure measured twice-once when you are at a hospital or clinic, and once when you are not at a hospital or clinic. Record the average of the two measurements. To check your blood pressure when you are not at a hospital or clinic, you can use: ? An automated blood pressure machine at a pharmacy. ? A home blood pressure monitor.  If you are between 16 years and 3 years old, ask your health care provider if you should take aspirin to prevent strokes.  Have regular diabetes screenings. This involves taking a blood sample to check your fasting blood sugar level. ? If you are at a normal weight and have a low risk for diabetes, have this test once every three years after 56 years of age. ? If you are overweight and have a high risk for diabetes, consider being tested at a younger age or more often. Preventing infection Hepatitis B  If you have a higher risk for hepatitis B, you should be screened for this virus. You are considered at high risk for hepatitis B if: ? You were born in a country where hepatitis B is common. Ask your health care provider which countries are considered high risk. ? Your parents were born in a high-risk country, and you have not been immunized against hepatitis B (hepatitis B vaccine). ? You have HIV or  AIDS. ? You use needles to inject street drugs. ? You live with someone who has hepatitis B. ? You have had sex with someone who has hepatitis B. ? You get hemodialysis treatment. ? You take certain medicines for conditions, including cancer, organ transplantation, and autoimmune conditions.  Hepatitis C  Blood testing is recommended for: ? Everyone born from [birth year] through 1965. ? Anyone with known risk factors for hepatitis C.  Sexually transmitted infections (STIs)  You should be screened for sexually transmitted infections (STIs) including gonorrhea and chlamydia if: ? You are sexually active and are younger than 56 years of age. ? You are older than 56 years of age and your health care provider tells you that you are at risk for this type of infection. ? Your sexual activity has changed since you were last screened and you are at an increased risk for chlamydia or gonorrhea. Ask your health care provider if you are at risk.  If you do not have HIV, but are at risk, it may be recommended that you take a prescription medicine daily to prevent HIV infection. This is called pre-exposure prophylaxis (PrEP). You are considered at risk if: ? You are sexually active and do not regularly use condoms or know the HIV status of your partner(s). ? You take drugs by injection. ? You are sexually active with a partner who has HIV.  Talk with your health care provider about whether you are at high risk of being infected with HIV. If you choose to begin PrEP, you should first be tested for HIV. You should then be tested every 3 months for as long as you are taking PrEP. Pregnancy  If you are premenopausal and you may become pregnant, ask your health care provider about preconception counseling.  If you may become pregnant, take 400 to 800 micrograms (mcg) of folic acid every day.  If you want to prevent pregnancy, talk to your health care provider about birth control (contraception). Osteoporosis  and menopause  Osteoporosis is a disease in which the bones lose minerals and strength with aging. This can result in serious bone fractures. Your risk for osteoporosis can be identified using a bone density scan.  If you are 84 years of age or older, or if you are at risk for osteoporosis and fractures, ask your health care provider if you should be screened.  Ask your health care provider whether you should take a calcium or vitamin D supplement to lower your risk for osteoporosis.  Menopause may have certain physical symptoms and risks.  Hormone replacement therapy may reduce some of these symptoms and risks. Talk to your health care provider about whether hormone replacement therapy is right for you. Follow these instructions at home:  Schedule regular health, dental, and eye exams.  Stay current with your immunizations.  Do not use any tobacco products including cigarettes, chewing tobacco, or electronic cigarettes.  If you are pregnant, do not drink alcohol.  If you are breastfeeding, limit how much and how often you drink alcohol.  Limit alcohol intake to no more than 1 drink per day for nonpregnant women. One drink equals 12 ounces of beer, 5 ounces of wine, or 1 ounces of hard liquor.  Do not use street drugs.  Do not share needles.  Ask your health care provider for help if you need support or information about quitting drugs.  Tell your health care provider if you often feel depressed.  Tell your health care provider if you have ever been abused or do not feel safe at home. This information is not intended to replace advice given to you by your health care provider. Make sure you discuss any questions you have with your health care provider. Document Released: 04/06/2011 Document Revised: 02/27/2016 Document Reviewed: 06/25/2015 Elsevier Interactive Patient Education  2018 [place] Ask your health care provider which exercises are  safe for  you. Do exercises exactly as told by your health care provider and adjust them as directed. It is normal to feel mild stretching, pulling, tightness, or discomfort as you do these exercises, but you should stop right away if you feel sudden pain or your pain gets worse. Do not begin these exercises until told by your health care provider. Stretching and range of motion exercises These exercises warm up your muscles and joints and improve the movement and flexibility of your back. These exercises also help to relieve pain, numbness, and tingling. Exercise A: Lumbar rotation  1. Lie on your back on a firm surface and bend your knees. 2. Straighten your arms out to your sides so each arm forms an "L" shape with a side of your body (a 90 degree angle). 3. Slowly move both of your knees to one side of your body until you feel a stretch in your lower back. Try not to let your shoulders move off of the floor. 4. Hold for __________ seconds. 5. Tense your abdominal muscles and slowly move your knees back to the starting position. 6. Repeat this exercise on the other side of your body. Repeat __________ times. Complete this exercise __________ times a day. Exercise B: Prone extension on elbows  1. Lie on your abdomen on a firm surface. 2. Prop yourself up on your elbows. 3. Use your arms to help lift your chest up until you feel a gentle stretch in your abdomen and your lower back. ? This will place some of your body weight on your elbows. If this is uncomfortable, try stacking pillows under your chest. ? Your hips should stay down, against the surface that you are lying on. Keep your hip and back muscles relaxed. 4. Hold for __________ seconds. 5. Slowly relax your upper body and return to the starting position. Repeat __________ times. Complete this exercise __________ times a day. Strengthening exercises These exercises build strength and endurance in your back. Endurance is the ability to  use your muscles for a long time, even after they get tired. Exercise C: Pelvic tilt 1. Lie on your back on a firm surface. Bend your knees and keep your feet flat. 2. Tense your abdominal muscles. Tip your pelvis up toward the ceiling and flatten your lower back into the floor. ? To help with this exercise, you may place a small towel under your lower back and try to push your back into the towel. 3. Hold for __________ seconds. 4. Let your muscles relax completely before you repeat this exercise. Repeat __________ times. Complete this exercise __________ times a day. Exercise D: Alternating arm and leg raises  1. Get on your hands and knees on a firm surface. If you are on a hard floor, you may want to use padding to cushion your knees, such as an exercise mat. 2. Line up your arms and legs. Your hands should be below your shoulders, and your knees should be below your hips. 3. Lift your left leg behind you. At the same time, raise your right arm and straighten it in front of you. ? Do not lift your leg higher than your hip. ? Do not lift your arm higher than your shoulder. ? Keep your abdominal and back muscles tight. ? Keep your hips facing the ground. ? Do not arch your back. ? Keep your balance carefully, and do not hold your breath. 4. Hold for __________ seconds. 5. Slowly return to the starting position and repeat with your  right leg and your left arm. Repeat __________ times. Complete this exercise __________ times a day. Exercise E: Abdominal set with straight leg raise  1. Lie on your back on a firm surface. 2. Bend one of your knees and keep your other leg straight. 3. Tense your abdominal muscles and lift your straight leg up, 4-6 inches (10-15 cm) off the ground. 4. Keep your abdominal muscles tight and hold for __________ seconds. ? Do not hold your breath. ? Do not arch your back. Keep it flat against the ground. 5. Keep your abdominal muscles tense as you slowly lower  your leg back to the starting position. 6. Repeat with your other leg. Repeat __________ times. Complete this exercise __________ times a day. Posture and body mechanics  Body mechanics refers to the movements and positions of your body while you do your daily activities. Posture is part of body mechanics. Good posture and healthy body mechanics can help to relieve stress in your body's tissues and joints. Good posture means that your spine is in its natural S-curve position (your spine is neutral), your shoulders are pulled back slightly, and your head is not tipped forward. The following are general guidelines for applying improved posture and body mechanics to your everyday activities. Standing   When standing, keep your spine neutral and your feet about hip-width apart. Keep a slight bend in your knees. Your ears, shoulders, and hips should line up.  When you do a task in which you stand in one place for a long time, place one foot up on a stable object that is 2-4 inches (5-10 cm) high, such as a footstool. This helps keep your spine neutral. Sitting   When sitting, keep your spine neutral and keep your feet flat on the floor. Use a footrest, if necessary, and keep your thighs parallel to the floor. Avoid rounding your shoulders, and avoid tilting your head forward.  When working at a desk or a computer, keep your desk at a height where your hands are slightly lower than your elbows. Slide your chair under your desk so you are close enough to maintain good posture.  When working at a computer, place your monitor at a height where you are looking straight ahead and you do not have to tilt your head forward or downward to look at the screen. Resting   When lying down and resting, avoid positions that are most painful for you.  If you have pain with activities such as sitting, bending, stooping, or squatting (flexion-based activities), lie in a position in which your body does not bend  very much. For example, avoid curling up on your side with your arms and knees near your chest (fetal position).  If you have pain with activities such as standing for a long time or reaching with your arms (extension-based activities), lie with your spine in a neutral position and bend your knees slightly. Try the following positions:  Lying on your side with a pillow between your knees.  Lying on your back with a pillow under your knees. Lifting   When lifting objects, keep your feet at least shoulder-width apart and tighten your abdominal muscles.  Bend your knees and hips and keep your spine neutral. It is important to lift using the strength of your legs, not your back. Do not lock your knees straight out.  Always ask for help to lift heavy or awkward objects. This information is not intended to replace advice given to you by  your health care provider. Make sure you discuss any questions you have with your health care provider. Document Released: 09/21/2005 Document Revised: 05/28/2016 Document Reviewed: 07/03/2015 Elsevier Interactive Patient Education  2018 [place].   Hip Exercises Ask your health care provider which exercises are safe for you. Do exercises exactly as told by your health care provider and adjust them as directed. It is normal to feel mild stretching, pulling, tightness, or discomfort as you do these exercises, but you should stop right away if you feel sudden pain or your pain gets worse.Do not begin these exercises until told by your health care provider. STRETCHING AND RANGE OF MOTION EXERCISES These exercises warm up your muscles and joints and improve the movement and flexibility of your hip. These exercises also help to relieve pain, numbness, and tingling. Exercise A: Hamstrings, Supine  1. Lie on your back. 2. Loop a belt or towel over the ball of your left / rightfoot. The ball of your foot is on the walking surface, right under your  toes. 3. Straighten your left / rightknee and slowly pull on the belt to raise your leg. ? Do not let your left / right knee bend while you do this. ? Keep your other leg flat on the floor. ? Raise the left / right leg until you feel a gentle stretch behind your left / right knee or thigh. 4. Hold this position for __________ seconds. 5. Slowly return your leg to the starting position. Repeat __________ times. Complete this stretch __________ times a day. Exercise B: Hip Rotators  1. Lie on your back on a firm surface. 2. Hold your left / right knee with your left / right hand. Hold your ankle with your other hand. 3. Gently pull your left / right knee and rotate your lower leg toward your other shoulder. ? Pull until you feel a stretch in your buttocks. ? Keep your hips and shoulders firmly planted while you do this stretch. 4. Hold this position for __________ seconds. Repeat __________ times. Complete this stretch __________ times a day. Exercise C: V-Sit (Hamstrings and Adductors)  1. Sit on the floor with your legs extended in a large "V" shape. Keep your knees straight during this exercise. 2. Start with your head and chest upright, then bend at your waist to reach for your left foot (position A). You should feel a stretch in your right inner thigh. 3. Hold this position for __________ seconds. Then slowly return to the upright position. 4. Bend at your waist to reach forward (position B). You should feel a stretch behind both of your thighs and knees. 5. Hold this position for __________ seconds. Then slowly return to the upright position. 6. Bend at your waist to reach for your right foot (position C). You should feel a stretch in your left inner thigh. 7. Hold this position for __________ seconds. Then slowly return to the upright position. Repeat __________ times. Complete this stretch __________ times a day. Exercise D: Lunge (Hip Flexors)  1. Place your left / right knee on  the floor and bend your other knee so that is directly over your ankle. You should be half-kneeling. 2. Keep good posture with your head over your shoulders. 3. Tighten your buttocks to point your tailbone downward. This helps your back to keep from arching too much. 4. You should feel a gentle stretch in the front of your left / right thigh and hip. If you do not feel any resistance, slightly  slide your other foot forward and then slowly lunge forward so your knee once again lines up over your ankle. 5. Make sure your tailbone continues to point downward. 6. Hold this position for __________ seconds. Repeat __________ times. Complete this stretch __________ times a day. STRENGTHENING EXERCISES These exercises build strength and endurance in your hip. Endurance is the ability to use your muscles for a long time, even after they get tired. Exercise E: Bridge (Hip Extensors)  1. Lie on your back on a firm surface with your knees bent and your feet flat on the floor. 2. Tighten your buttocks muscles and lift your bottom off the floor until the trunk of your body is level with your thighs. ? Do not arch your back. ? You should feel the muscles working in your buttocks and the back of your thighs. If you do not feel these muscles, slide your feet 1-2 inches (2.5-5 cm) farther away from your buttocks. 3. Hold this position for __________ seconds. 4. Slowly lower your hips to the starting position. 5. Let your muscles relax completely between repetitions. 6. If this exercise is too easy, try doing it with your arms crossed over your chest. Repeat __________ times. Complete this exercise __________ times a day. Exercise F: Straight Leg Raises - Hip Abductors  1. Lie on your side with your left / right leg in the top position. Lie so your head, shoulder, knee, and hip line up with each other. You may bend your bottom knee to help you balance. 2. Roll your hips slightly forward, so your hips are stacked  directly over each other and your left / right knee is facing forward. 3. Leading with your heel, lift your top leg 4-6 inches (10-15 cm). You should feel the muscles in your outer hip lifting. ? Do not let your foot drift forward. ? Do not let your knee roll toward the ceiling. 4. Hold this position for __________ seconds. 5. Slowly return to the starting position. 6. Let your muscles relax completely between repetitions. Repeat __________ times. Complete this exercise __________ times a day. Exercise G: Straight Leg Raises - Hip Adductors  1. Lie on your side with your left / right leg in the bottom position. Lie so your head, shoulder, knee, and hip line up. You may place your upper foot in front to help you balance. 2. Roll your hips slightly forward, so your hips are stacked directly over each other and your left / right knee is facing forward. 3. Tense the muscles in your inner thigh and lift your bottom leg 4-6 inches (10-15 cm). 4. Hold this position for __________ seconds. 5. Slowly return to the starting position. 6. Let your muscles relax completely between repetitions. Repeat __________ times. Complete this exercise __________ times a day. Exercise H: Straight Leg Raises - Quadriceps  1. Lie on your back with your left / right leg extended and your other knee bent. 2. Tense the muscles in the front of your left / right thigh. When you do this, you should see your kneecap slide up or see increased dimpling just above your knee. 3. Tighten these muscles even more and raise your leg 4-6 inches (10-15 cm) off the floor. 4. Hold this position for __________ seconds. 5. Keep these muscles tense as you lower your leg. 6. Relax the muscles slowly and completely between repetitions. Repeat __________ times. Complete this exercise __________ times a day. Exercise I: Hip Abductors, Standing 1. Tie one end of a  rubber exercise band or tubing to a secure surface, such as a table or  pole. 2. Loop the other end of the band or tubing around your left / right ankle. 3. Keeping your ankle with the band or tubing directly opposite of the secured end, step away until there is tension in the tubing or band. Hold onto a chair as needed for balance. 4. Lift your left / right leg out to your side. While you do this: ? Keep your back upright. ? Keep your shoulders over your hips. ? Keep your toes pointing forward. ? Make sure to use your hip muscles to lift your leg. Do not "throw" your leg or tip your body to lift your leg. 5. Hold this position for __________ seconds. 6. Slowly return to the starting position. Repeat __________ times. Complete this exercise __________ times a day. Exercise J: Squats (Quadriceps) 1. Stand in a door frame so your feet and knees are in line with the frame. You may place your hands on the frame for balance. 2. Slowly bend your knees and lower your hips like you are going to sit in a chair. ? Keep your lower legs in a straight-up-and-down position. ? Do not let your hips go lower than your knees. ? Do not bend your knees lower than told by your health care provider. ? If your hip pain increases, do not bend as low. 3. Hold this position for ___________ seconds. 4. Slowly push with your legs to return to standing. Do not use your hands to pull yourself to standing. Repeat __________ times. Complete this exercise __________ times a day. This information is not intended to replace advice given to you by your health care provider. Make sure you discuss any questions you have with your health care provider. Document Released: 10/09/2005 Document Revised: 06/15/2016 Document Reviewed: 09/16/2015 Elsevier Interactive Patient Education  [employer].

## 2017-05-05 NOTE — Progress Notes (Signed)
Subjective:    Patient ID: Patty MurrainRhonda Adams, female    DOB: 1961/09/05, 56 y.o.   MRN: [id number]  Chief Complaint  Patient presents with  . CPE    fasting, wants to talk about right hip pain     HPI:  Patty Adams is a 56 y.o. female who presents today for an annual wellness visit.   1) Health Maintenance -   Diet - Averaging about 3 meals per day consisting of a regular diet; Caffeine intake of about 1-2 cups daily.   Exercise - Working on getting back into the gym.    2) Preventative Exams / Immunizations:  Dental -- Due for exam; scheduled.   Vision -- Up to date   Health Maintenance  Topic Date Due  . HIV Screening  05/27/1976  . PAP SMEAR  05/27/1982  . INFLUENZA VACCINE  05/05/2017  . MAMMOGRAM  04/14/2019  . COLONOSCOPY  05/06/2023  . TETANUS/TDAP  12/08/2025  . Hepatitis C Screening  Completed    Immunization History  Administered Date(s) Administered  . Influenza,inj,Quad PF,36+ Mos 08/17/2016  . Tdap 12/09/2015   3.) Joint pains - This is a new problem. Associated of pain located in her right hip, left buttock and right arm have been waxing and waning for about 4 months. No trauma or injury. Does have previous back problems. Pain is continuous on the right side with radiculopathy down her right leg. Modifying factors include Duexis which does help with her pain. Severity of the pain is mild/moderate and aggravate when she starts moving following being seated for a long period of time. Symptoms are generally worse throughout the day.    No Known Allergies   Outpatient Medications Prior to Visit  Medication Sig Dispense Refill  . amLODipine-benazepril (LOTREL) 10-40 MG capsule TAKE ONE CAPSULE BY MOUTH ONCE DAILY 90 capsule 2  . clonazePAM (KLONOPIN) 0.5 MG tablet TAKE ONE TABLET BY MOUTH TWICE DAILY AS NEEDED FOR ANXIETY 30 tablet 0  . hydrochlorothiazide (HYDRODIURIL) 25 MG tablet TAKE ONE TABLET BY MOUTH ONCE DAILY 30 tablet 11  . Ibuprofen-Famotidine  800-26.6 MG TABS Take 1 tablet by mouth 3 (three) times daily as needed. 90 tablet 1  . predniSONE (DELTASONE) 20 MG tablet Take 3 tablets by mouth for 3 days, 2 tablets by mouth for 3 days and 1 tablet by mouth for 3 days. 18 tablet 0   No facility-administered medications prior to visit.      Past Medical History:  Diagnosis Date  . Chicken pox   . Hyperlipidemia   . Hypertension   . UTI (lower urinary tract infection)      Past Surgical History:  Procedure Laterality Date  . TUBAL LIGATION       Family History  Problem Relation Age of Onset  . Hyperlipidemia Mother   . Hypertension Mother   . Dementia Mother   . Hyperlipidemia Father   . Hypertension Father   . Diabetes Father   . Diabetes Maternal Grandmother   . Hypertension Maternal Grandfather   . Hypertension Paternal Grandmother   . Diabetes Paternal Grandmother      Social History   Social History  . Marital status: Married    Spouse name: N/A  . Number of children: 3  . 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 Smoking status: Never Smoker  . Smokeless tobacco: Never Used  . Alcohol use Yes  Comment: occasionally  . Drug use: No  . Sexual activity: Not on file   Other Topics Concern  . Not on file   Social History Narrative   Fun: Paint, Travel   Denies any religious beliefs effecting health care.    Feels safe at home and denies abuse.       Review of Systems  Constitutional: Denies fever, chills, fatigue, or significant weight gain/loss. HENT: Head: Denies headache or neck pain Ears: Denies changes in hearing, ringing in ears, earache, drainage Nose: Denies discharge, stuffiness, itching, nosebleed, sinus pain Throat: Denies sore throat, hoarseness, dry mouth, sores, thrush Eyes: Denies loss/changes in vision, pain, redness, blurry/double vision, flashing lights Cardiovascular: Denies chest pain/discomfort, tightness,  palpitations, shortness of breath with activity, difficulty lying down, swelling, sudden awakening with shortness of breath Respiratory: Denies shortness of breath, cough, sputum production, wheezing Gastrointestinal: Denies dysphasia, heartburn, change in appetite, nausea, change in bowel habits, rectal bleeding, constipation, diarrhea, yellow skin or eyes Genitourinary: Denies frequency, urgency, burning/pain, blood in urine, incontinence, change in urinary strength. Musculoskeletal: Denies muscle/joint pain, stiffness, back pain, redness or swelling of joints, trauma Skin: Denies rashes, lumps, itching, dryness, color changes, or hair/nail changes Neurological: Denies dizziness, fainting, seizures, weakness, numbness, tingling, tremor Psychiatric - Denies nervousness, stress, depression or memory loss Endocrine: Denies heat or cold intolerance, sweating, frequent urination, excessive thirst, changes in appetite Hematologic: Denies ease of bruising or bleeding     Objective:     BP (!) 150/94 (BP Location: Left Arm, Patient Position: Sitting, Cuff Size: Large)   Pulse 69   Temp 98.3 F (36.8 C) (Oral)   Resp 16   Ht 5\' 5"  (1.651 m)   Wt 180 lb 1.9 oz (81.7 kg)   SpO2 96%   BMI 29.97 kg/m  Nursing note and vital signs reviewed.  Physical Exam  Constitutional: She is oriented to person, place, and time. She appears well-developed and well-nourished.  HENT:  Head: Normocephalic.  Right Ear: Hearing, tympanic membrane, external ear and ear canal normal.  Left Ear: Hearing, tympanic membrane, external ear and ear canal normal.  Nose: Nose normal.  Mouth/Throat: Uvula is midline, oropharynx is clear and moist and mucous membranes are normal.  Eyes: Pupils are equal, round, and reactive to light. Conjunctivae and EOM are normal.  Neck: Neck supple. No JVD present. No tracheal deviation present. No thyromegaly present.  Cardiovascular: Normal rate, regular rhythm, normal heart sounds  and intact distal pulses.   Pulmonary/Chest: Effort normal and breath sounds normal.  Abdominal: Soft. Bowel sounds are normal. She exhibits no distension and no mass. There is no tenderness. There is no rebound and no guarding.  Musculoskeletal: Normal range of motion. She exhibits no edema or tenderness.  Bilateral hips - no obvious deformity, discoloration, or edema. Tenderness over the gluteus medius and piriformis is no crepitus or deformity. Range of motion within normal limits. Strength is normal with discomfort noted with external rotation bilaterally. Negative straight leg raise and negative Faber's. Distal pulses and sensation are intact and appropriate.  Lymphadenopathy:    She has no cervical adenopathy.  Neurological: She is alert and oriented to person, place, and time. She has normal reflexes. No cranial nerve deficit. She exhibits normal muscle tone. Coordination normal.  Skin: Skin is warm and dry.  Psychiatric: She has a normal mood and affect. Her behavior is normal. Judgment and thought content normal.       Assessment & Plan:   Problem List Items Addressed This  Visit      Other   Encounter for general adult medical examination with abnormal findings - Primary    1) Anticipatory Guidance: Discussed importance of wearing a seatbelt while driving and not texting while driving; changing batteries in smoke detector at least once annually; wearing suntan lotion when outside; eating a balanced and moderate diet; getting physical activity at least 30 minutes per day.  2) Immunizations / Screenings / Labs:  All immunizations are up-to-date per recommendations. Due for a dental exam which is scheduled. Due for cervical cancer screening with referral to gynecology placed. Obtain HIV antibody for HIV screening. Breast and: cancer screening are up-to-date per recommendations. Obtain CBC, CMET, and lipid profile.    Overall well exam with risk factors for cardiovascular disease  including hypertension and overweight. Recommend weight loss of 5-10% of current body weight through nutrition and physical activity. Blood pressure slightly elevated today above goal 140/90. Encourage monitor blood pressure at home and follow low-sodium diet. Encourage nutritional intake that is moderate, balance, and varied. Work on increasing fiber intake to 20-25 g per day. Continue other healthy lifestyle behaviors and choices. Follow-up prevention exam in 1 year. Follow-up office visit pending blood work and for chronic conditions.      Relevant Orders   Comprehensive metabolic panel   Lipid panel   HIV antibody   CBC   Bilateral hip pain    Bilateral hip pain with concern for muscle tightness and imbalance similar to low back previously diagnosed. Treat conservatively with ice/moist heat, home exercise therapy, and continue previously prescribed Duexis. Consider physical therapy if symptoms worsen or do not improve.       Other Visit Diagnoses    Cervical cancer screening       Relevant Orders   Ambulatory referral to Gynecology       I have discontinued Ms. Gregori's predniSONE. I am also having her maintain her clonazePAM, Ibuprofen-Famotidine, amLODipine-benazepril, and hydrochlorothiazide.   Follow-up: Return in about 2 months (around 07/05/2017), or if symptoms worsen or fail to improve.   Jeanine Luz, FNP

## 2017-05-05 NOTE — Assessment & Plan Note (Signed)
1) Anticipatory Guidance: Discussed importance of wearing a seatbelt while driving and not texting while driving; changing batteries in smoke detector at least once annually; wearing suntan lotion when outside; eating a balanced and moderate diet; getting physical activity at least 30 minutes per day.  2) Immunizations / Screenings / Labs:  All immunizations are up-to-date per recommendations. Due for a dental exam which is scheduled. Due for cervical cancer screening with referral to gynecology placed. Obtain HIV antibody for HIV screening. Breast and: cancer screening are up-to-date per recommendations. Obtain CBC, CMET, and lipid profile.    Overall well exam with risk factors for cardiovascular disease including hypertension and overweight. Recommend weight loss of 5-10% of current body weight through nutrition and physical activity. Blood pressure slightly elevated today above goal 140/90. Encourage monitor blood pressure at home and follow low-sodium diet. Encourage nutritional intake that is moderate, balance, and varied. Work on increasing fiber intake to 20-25 g per day. Continue other healthy lifestyle behaviors and choices. Follow-up prevention exam in 1 year. Follow-up office visit pending blood work and for chronic conditions.

## 2017-05-05 NOTE — Assessment & Plan Note (Signed)
Bilateral hip pain with concern for muscle tightness and imbalance similar to low back previously diagnosed. Treat conservatively with ice/moist heat, home exercise therapy, and continue previously prescribed Duexis. Consider physical therapy if symptoms worsen or do not improve.

## 2017-05-06 ENCOUNTER — Encounter: Payer: Self-pay | Admitting: Family

## 2017-05-31 ENCOUNTER — Ambulatory Visit: Payer: 59 | Admitting: Women's Health

## 2017-06-23 ENCOUNTER — Ambulatory Visit: Payer: 59 | Admitting: Women's Health

## 2017-11-09 ENCOUNTER — Other Ambulatory Visit: Payer: Self-pay | Admitting: Family

## 2017-11-09 ENCOUNTER — Telehealth: Payer: Self-pay | Admitting: Nurse Practitioner

## 2017-11-09 DIAGNOSIS — F411 Generalized anxiety disorder: Secondary | ICD-10-CM

## 2017-11-09 MED ORDER — AMLODIPINE BESY-BENAZEPRIL HCL 10-40 MG PO CAPS: 1.0000 | ORAL_CAPSULE | Freq: Every day | ORAL | 0 refills | Status: DC

## 2017-11-09 NOTE — Telephone Encounter (Signed)
Rx sent 

## 2017-11-09 NOTE — Telephone Encounter (Signed)
Copied from CRM [phone]. Topic: General - Other >> Nov 09, 2017  9:10 AM Percival SpanishKennedy, Cheryl W wrote:  [facility] [place] [facility] and need a refill on amLODipine-benazepril (LOTREL) 10-40 MG capsule  [facility]

## 2017-11-11 ENCOUNTER — Ambulatory Visit: Payer: 59

## 2018-01-24 ENCOUNTER — Encounter: Payer: Self-pay | Admitting: Family

## 2018-01-24 ENCOUNTER — Ambulatory Visit (INDEPENDENT_AMBULATORY_CARE_PROVIDER_SITE_OTHER)
Admission: RE | Admit: 2018-01-24 | Discharge: 2018-01-24 | Disposition: A | Discharge status: Home / Self Care | Payer: 59 | Source: Ambulatory Visit | Attending: Family | Admitting: Family

## 2018-01-24 ENCOUNTER — Other Ambulatory Visit: Payer: 59

## 2018-01-24 ENCOUNTER — Ambulatory Visit: Payer: 59 | Admitting: Nurse Practitioner

## 2018-01-24 ENCOUNTER — Ambulatory Visit (INDEPENDENT_AMBULATORY_CARE_PROVIDER_SITE_OTHER): Payer: 59 | Admitting: Family

## 2018-01-24 VITALS — BP 148/88 | HR 76 | Temp 97.9°F | Ht 65.0 in | Wt 177.0 lb

## 2018-01-24 DIAGNOSIS — I10 Essential (primary) hypertension: Secondary | ICD-10-CM

## 2018-01-24 DIAGNOSIS — M5440 Lumbago with sciatica, unspecified side: Secondary | ICD-10-CM | POA: Diagnosis not present

## 2018-01-24 DIAGNOSIS — M545 Low back pain: Secondary | ICD-10-CM | POA: Diagnosis not present

## 2018-01-24 MED ORDER — HYDROCHLOROTHIAZIDE 25 MG PO TABS: 25.0000 mg | ORAL_TABLET | Freq: Every day | ORAL | 1 refills | Status: DC

## 2018-01-24 MED ORDER — METHYLPREDNISOLONE 4 MG PO TBPK: ORAL_TABLET | ORAL | 0 refills | Status: DC

## 2018-01-24 MED ORDER — AMLODIPINE BESY-BENAZEPRIL HCL 10-40 MG PO CAPS: 1.0000 | ORAL_CAPSULE | Freq: Every day | ORAL | 1 refills | Status: DC

## 2018-01-24 NOTE — Progress Notes (Signed)
Patty MurrainRhonda Adams is a 57 y.o. female with the following history as recorded in EpicCare:  Patient Active Problem List   Diagnosis Date Noted  . Bilateral hip pain 05/05/2017  . Lateral epicondylitis of right elbow 08/17/2016  . Carpal tunnel syndrome of right wrist 02/27/2016  . Anxiety state 03/27/2015  . Right hip pain 01/10/2015  . Essential hypertension 01/10/2015    Current Outpatient Medications  Medication Sig Dispense Refill  . amLODipine-benazepril (LOTREL) 10-40 MG capsule Take 1 capsule by mouth daily. 90 capsule 1  . hydrochlorothiazide (HYDRODIURIL) 25 MG tablet Take 1 tablet (25 mg total) by mouth daily. 90 tablet 1  . Ibuprofen-Famotidine 800-26.6 MG TABS Take 1 tablet by mouth 3 (three) times daily as needed. 90 tablet 1  . methylPREDNISolone (MEDROL DOSEPAK) 4 MG TBPK tablet Taper as directed 21 tablet 0   No current facility-administered medications for this visit.     Allergies: Patient has no known allergies.  Past Medical History:  Diagnosis Date  . Chicken pox   . Hyperlipidemia   . Hypertension   . UTI (lower urinary tract infection)     Past Surgical History:  Procedure Laterality Date  . TUBAL LIGATION      Family History  Problem Relation Age of Onset  . Hyperlipidemia Mother   . Hypertension Mother   . Dementia Mother   . Hyperlipidemia Father   . Hypertension Father   . Diabetes Father   . Diabetes Maternal Grandmother   . Hypertension Maternal Grandfather   . Hypertension Paternal Grandmother   . Diabetes Paternal Grandmother     Social History   Tobacco Use  . Smoking status: Never Smoker  . Smokeless tobacco: Never Used  Substance Use Topics  . Alcohol use: Yes    Comment: occasionally    Subjective:  Patient presents with chronic low back pain; symptoms present x at least 3-4 years; no bowel or bladder changes; symptoms are better when lying down; worse with standing for extended period of time; did have MRI of right hip in 2016 which  showed arthritis changes;   Has not been on blood pressure medication for the past 3-4 days; does monitor regularly and normal when on medication; Denies any chest pain, shortness of breath, blurred vision or headache   Objective:  Vitals:   01/24/18 1402  BP: (!) 148/88  Pulse: 76  Temp: 97.9 F (36.6 C)  TempSrc: Oral  SpO2: 99%  Weight: 177 lb (80.3 kg)  Height: 5\' 5"  (1.651 m)    General: Well developed, well nourished, in no acute distress  Skin : Warm and dry.  Head: Normocephalic and atraumatic  Eyes: Sclera and conjunctiva clear; pupils round and reactive to light; extraocular movements intact  Ears: External normal; canals clear; tympanic membranes normal  Oropharynx: Pink, supple. No suspicious lesions  Neck: Supple without thyromegaly, adenopathy  Lungs: Respirations unlabored; clear to auscultation bilaterally without wheeze, rales, rhonchi  CVS exam: normal rate and regular rhythm.  Musculoskeletal: No deformities; no active joint inflammation  Extremities: No edema, cyanosis, clubbing  Vessels: Symmetric bilaterally  Neurologic: Alert and oriented; speech intact; face symmetrical; moves all extremities well; CNII-XII intact without focal deficit   Assessment:  1. Bilateral low back pain with sciatica, sciatica laterality unspecified, unspecified chronicity   2. Essential hypertension     Plan:  1. Update lumbar X-ray; Rx for Medrol Dose Pak; will most likely need MRI; follow-up to be determined; 2. Patient has been off her medication  for the past 4 days; she has re-started medication today; encouraged to take daily as prescribed; will plan to follow-up and update CPE within 2-3 months.   No follow-ups on file.  Orders Placed This Encounter  Procedures  . DG Lumbar Spine 2-3 Views    Standing Status:   Future    Number of Occurrences:   1    Standing Expiration Date:   03/27/2019    Order Specific Question:   Reason for Exam (SYMPTOM  OR DIAGNOSIS REQUIRED)     Answer:   low back pain with radiculopathy    Order Specific Question:   Is patient pregnant?    Answer:   No    Order Specific Question:   Preferred imaging location?    Answer:   [facility]    Order Specific Question:   Radiology Contrast Protocol - do NOT remove file path    Answer:   [email or web address]    Requested Prescriptions   Signed Prescriptions Disp Refills  . methylPREDNISolone (MEDROL DOSEPAK) 4 MG TBPK tablet 21 tablet 0    Sig: Taper as directed  . amLODipine-benazepril (LOTREL) 10-40 MG capsule 90 capsule 1    Sig: Take 1 capsule by mouth daily.  . hydrochlorothiazide (HYDRODIURIL) 25 MG tablet 90 tablet 1    Sig: Take 1 tablet (25 mg total) by mouth daily.

## 2018-01-26 ENCOUNTER — Other Ambulatory Visit: Payer: Self-pay | Admitting: Family

## 2018-01-26 DIAGNOSIS — M5440 Lumbago with sciatica, unspecified side: Principal | ICD-10-CM

## 2018-01-26 DIAGNOSIS — G8929 Other chronic pain: Secondary | ICD-10-CM

## 2018-02-14 ENCOUNTER — Encounter: Payer: Self-pay | Admitting: Family

## 2018-07-22 ENCOUNTER — Other Ambulatory Visit: Payer: Self-pay | Admitting: Family

## 2018-07-22 MED ORDER — AMLODIPINE BESY-BENAZEPRIL HCL 10-40 MG PO CAPS: 1.0000 | ORAL_CAPSULE | Freq: Every day | ORAL | 0 refills | Status: DC

## 2018-07-22 MED ORDER — HYDROCHLOROTHIAZIDE 25 MG PO TABS: 25.0000 mg | ORAL_TABLET | Freq: Every day | ORAL | 0 refills | Status: DC

## 2018-10-24 ENCOUNTER — Other Ambulatory Visit (HOSPITAL_COMMUNITY)
Admission: RE | Admit: 2018-10-24 | Discharge: 2018-10-24 | Disposition: A | Discharge status: Home / Self Care | Payer: 59 | Source: Ambulatory Visit | Attending: Family | Admitting: Family

## 2018-10-24 ENCOUNTER — Ambulatory Visit (INDEPENDENT_AMBULATORY_CARE_PROVIDER_SITE_OTHER): Payer: 59 | Admitting: Family

## 2018-10-24 ENCOUNTER — Encounter: Payer: Self-pay | Admitting: Family

## 2018-10-24 ENCOUNTER — Other Ambulatory Visit: Payer: 59

## 2018-10-24 VITALS — BP 138/82 | HR 73 | Temp 98.5°F | Ht 65.0 in | Wt 182.1 lb

## 2018-10-24 DIAGNOSIS — Z124 Encounter for screening for malignant neoplasm of cervix: Secondary | ICD-10-CM | POA: Insufficient documentation

## 2018-10-24 DIAGNOSIS — Z0000 Encounter for general adult medical examination without abnormal findings: Secondary | ICD-10-CM | POA: Diagnosis not present

## 2018-10-24 DIAGNOSIS — M545 Low back pain, unspecified: Secondary | ICD-10-CM

## 2018-10-24 DIAGNOSIS — M791 Myalgia, unspecified site: Secondary | ICD-10-CM | POA: Diagnosis not present

## 2018-10-24 DIAGNOSIS — Z13220 Encounter for screening for lipoid disorders: Secondary | ICD-10-CM

## 2018-10-24 DIAGNOSIS — Z1231 Encounter for screening mammogram for malignant neoplasm of breast: Secondary | ICD-10-CM

## 2018-10-24 MED ORDER — HYDROCHLOROTHIAZIDE 25 MG PO TABS: 25.0000 mg | ORAL_TABLET | Freq: Every day | ORAL | 3 refills | Status: DC

## 2018-10-24 MED ORDER — AMLODIPINE BESY-BENAZEPRIL HCL 10-40 MG PO CAPS: 1.0000 | ORAL_CAPSULE | Freq: Every day | ORAL | 3 refills | Status: DC

## 2018-10-24 NOTE — Progress Notes (Signed)
Patty Adams is a 58 y.o. female with the following history as recorded in Epic[facility]:  Patient Active Problem List   Diagnosis Date Noted  . Bilateral hip pain 05/05/2017  . Lateral epicondylitis of right elbow 08/17/2016  . Carpal tunnel syndrome of right wrist 02/27/2016  . Anxiety state 03/27/2015  . Right hip pain 01/10/2015  . Essential hypertension 01/10/2015    Current Outpatient Medications  Medication Sig Dispense Refill  . amLODipine-benazepril (LOTREL) 10-40 MG capsule Take 1 capsule by mouth daily. 90 capsule 3  . hydrochlorothiazide (HYDRODIURIL) 25 MG tablet Take 1 tablet (25 mg total) by mouth daily. 90 tablet 3   No current facility-administered medications for this visit.     Allergies: Patient has no known allergies.  Past Medical History:  Diagnosis Date  . Chicken pox   . Hyperlipidemia   . Hypertension   . UTI (lower urinary tract infection)     Past Surgical History:  Procedure Laterality Date  . TUBAL LIGATION      Family History  Problem Relation Age of Onset  . Hyperlipidemia Mother   . Hypertension Mother   . Dementia Mother   . Hyperlipidemia Father   . Hypertension Father   . Diabetes Father   . Diabetes Maternal Grandmother   . Hypertension Maternal Grandfather   . Hypertension Paternal Grandmother   . Diabetes Paternal Grandmother     Social History   Tobacco Use  . Smoking status: Never Smoker  . Smokeless tobacco: Never Used  Substance Use Topics  . Alcohol use: Yes    Comment: occasionally    Subjective:  Patient presents today for yearly CPE: needs to get her pap smear updated; unsure of last date of pap smear;  History of hypertension- on Lotrel and HCTZ; Denies any chest pain, shortness of breath, blurred vision or headache. Does check blood pressure regularly- averages 140/86 Currently seeing dentist regularly- having crown work done; [facility] regularly; Sleeping okay- occasionally using ZQuil ( 1 x / week); Needs to  work on more regular exercise; LMP- 10 years ago;  Colonoscopy due in 2024; Pap smear- will update today;    Objective:  Vitals:   10/24/18 1006  BP: 138/82  Pulse: 73  Temp: 98.5 F (36.9 C)  TempSrc: Oral  SpO2: 99%  Weight: 182 lb 1.3 oz (82.6 kg)  Height: 5' 5"  (1.651 m)    General: Well developed, well nourished, in no acute distress  Skin : Warm and dry.  Head: Normocephalic and atraumatic  Eyes: Sclera and conjunctiva clear; pupils round and reactive to light; extraocular movements intact  Ears: External normal; canals clear; tympanic membranes normal  Oropharynx: Pink, supple. No suspicious lesions  Neck: Supple without thyromegaly, adenopathy  Lungs: Respirations unlabored; clear to auscultation bilaterally without wheeze, rales, rhonchi  CVS exam: normal rate and regular rhythm.  Abdomen: Soft; nontender; nondistended; normoactive bowel sounds; no masses or hepatosplenomegaly  Musculoskeletal: No deformities; no active joint inflammation  Extremities: No edema, cyanosis, clubbing  Vessels: Symmetric bilaterally  Neurologic: Alert and oriented; speech intact; face symmetrical; moves all extremities well; CNII-XII intact without focal deficit  Pelvic exam: normal external genitalia, vulva, vagina, cervix, uterus and adnexa. Whitish vaginal discharge is noted  Assessment:  1. PE (physical exam), annual   2. Screening mammogram, encounter for   3. Lipid screening   4. Myalgia   5. Low back pain, unspecified back pain laterality, unspecified chronicity, unspecified whether sciatica present     Plan:  Age appropriate preventive healthcare needs addressed; encouraged regular eye doctor and dental exams; encouraged regular exercise; will update labs and refills as needed today; follow-up to be determined; Order for mammogram updated- needs to do yearly; Thin Prep pap collected; encouraged exercise, weight loss- goal is at least 10 pounds in the next 4 months; Schedule  follow-up with Dr. Tamala Julian about continued low back pain. Follow-up in 6 months with me for blood pressure check.   Return for Dr. Tamala Julian- low back pain.  Orders Placed This Encounter  Procedures  . MM Digital Screening    Standing Status:   Future    Standing Expiration Date:   12/23/2019    Order Specific Question:   Reason for Exam (SYMPTOM  OR DIAGNOSIS REQUIRED)    Answer:   screening mammogram    Order Specific Question:   Is the patient pregnant?    Answer:   No    Order Specific Question:   Preferred imaging location?    Answer:   [facility]  . DG Lumbar Spine 2-3 Views    Standing Status:   Future    Standing Expiration Date:   12/23/2019    Order Specific Question:   Reason for Exam (SYMPTOM  OR DIAGNOSIS REQUIRED)    Answer:   low back pain    Order Specific Question:   Is patient pregnant?    Answer:   No    Order Specific Question:   Preferred imaging location?    Answer:   [facility]    Order Specific Question:   Radiology Contrast Protocol - do NOT remove file path    Answer:   [email or web address]  . CBC w/Diff    Standing Status:   Future    Standing Expiration Date:   10/24/2019  . Comp Met (CMET)    Standing Status:   Future    Standing Expiration Date:   10/24/2019  . Lipid panel    Standing Status:   Future    Standing Expiration Date:   10/25/2019  . TSH    Standing Status:   Future    Standing Expiration Date:   10/24/2019  . Vitamin D (25 hydroxy)    Standing Status:   Future    Standing Expiration Date:   10/24/2019    Requested Prescriptions   Signed Prescriptions Disp Refills  . amLODipine-benazepril (LOTREL) 10-40 MG capsule 90 capsule 3    Sig: Take 1 capsule by mouth daily.  . hydrochlorothiazide (HYDRODIURIL) 25 MG tablet 90 tablet 3    Sig: Take 1 tablet (25 mg total) by mouth daily.

## 2018-10-26 ENCOUNTER — Other Ambulatory Visit: Payer: Self-pay | Admitting: Family

## 2018-10-26 LAB — CYTOLOGY - PAP
Bacterial vaginitis: POSITIVE — AB
CANDIDA VAGINITIS: NEGATIVE
CHLAMYDIA, DNA PROBE: NEGATIVE
Diagnosis: NEGATIVE
HPV: NOT DETECTED
Neisseria Gonorrhea: NEGATIVE
Trichomonas: NEGATIVE

## 2018-10-26 MED ORDER — METRONIDAZOLE 500 MG PO TABS: 500.0000 mg | ORAL_TABLET | Freq: Two times a day (BID) | ORAL | 0 refills | Status: DC

## 2018-11-05 NOTE — Progress Notes (Signed)
[facility] [address] [place], [place] [address] Phone: [phone] Subjective:   Patty Adams, am serving as a scribe for Dr. Antoine Primas.  I'm seeing this patient by the request  of:  Olive Bass, FNP   CC: Low back pain  [id number]  Patty Adams is a 58 y.o. female coming in with complaint of lower back and hip pain. Pain occurs in high hamstring. Pain in the mornings and after sitting for prolonged periods. Takes Duexis for pain. Pain does radiate down both legs, right greater than left. Constant, dull pain. Does try to move legs around but no formal stretching.    Patient did have x-rays of her back in April 2019.  Independently visualized by me showing the patient has some mild osteophytes from L3-S1.  Mild disc space narrowing at L5-S1.  Past Medical History:  Diagnosis Date  . Chicken pox   . Hyperlipidemia   . Hypertension   . UTI (lower urinary tract infection)    Past Surgical History:  Procedure Laterality Date  . TUBAL LIGATION     Social History   Socioeconomic History  . Marital status: Married    Spouse name: Not on file  . Number of children: 3  . Years of education: 49  . Highest education level: Not on file  Occupational History  . Occupation: Risk analyst  Social Needs  . Financial resource strain: Not on file  . Food insecurity:    Worry: Not on file    Inability: Not on file  . Transportation needs:    Medical: Not on file    Non-medical: Not on file  Tobacco Use  . Smoking status: Never Smoker  . Smokeless tobacco: Never Used  Substance and Sexual Activity  . Alcohol use: Yes    Comment: occasionally  . Drug use: No  . Sexual activity: Not on file  Lifestyle  . Physical activity:    Days per week: Not on file    Minutes per session: Not on file  . Stress: Not on file  Relationships  . Social connections:    Talks on phone: Not on file    Gets together: Not on file    Attends  religious service: Not on file    Active member of club or organization: Not on file    Attends meetings of clubs or organizations: Not on file    Relationship status: Not on file  Other Topics Concern  . Not on file  Social History Narrative   Fun: Paint, Travel   Denies any religious beliefs effecting health care.    Feels safe at home and denies abuse.    No Known Allergies Family History  Problem Relation Age of Onset  . Hyperlipidemia Mother   . Hypertension Mother   . Dementia Mother   . Hyperlipidemia Father   . Hypertension Father   . Diabetes Father   . Diabetes Maternal Grandmother   . Hypertension Maternal Grandfather   . Hypertension Paternal Grandmother   . Diabetes Paternal Grandmother      Current Outpatient Medications (Cardiovascular):  .  amLODipine-benazepril (LOTREL) 10-40 MG capsule, Take 1 capsule by mouth daily. .  hydrochlorothiazide (HYDRODIURIL) 25 MG tablet, Take 1 tablet (25 mg total) by mouth daily.     Current Outpatient Medications (Other):  .  metroNIDAZOLE (FLAGYL) 500 MG tablet, Take 1 tablet (500 mg total) by mouth 2 (two) times daily.    Past medical  history, social, surgical and family history all reviewed in electronic medical record.  No pertanent information unless stated regarding to the chief complaint.   Review of Systems:  No headache, visual changes, nausea, vomiting, diarrhea, constipation, dizziness, abdominal pain, skin rash, fevers, chills, night sweats, weight loss, swollen lymph nodes, body aches, joint swelling, muscle aches, chest pain, shortness of breath, mood changes.   Objective  There were no vitals taken for this visit. Systems examined below as of    General: No apparent distress alert and oriented x3 mood and affect normal, dressed appropriately.  HEENT: Pupils equal, extraocular movements intact  Respiratory: Patient's speak in full sentences and does not appear short of breath  Cardiovascular: No lower  extremity edema, non tender, no erythema  Skin: Warm dry intact with no signs of infection or rash on extremities or on axial skeleton.  Abdomen: Soft nontender  Neuro: Cranial nerves II through XII are intact, neurovascularly intact in all extremities with 2+ DTRs and 2+ pulses.  Lymph: No lymphadenopathy of posterior or anterior cervical chain or axillae bilaterally.  Gait normal with good balance and coordination.  MSK:  Non tender with full range of motion and good stability and symmetric strength and tone of shoulders, elbows, wrist, hip, knee and ankles bilaterally.  Back Exam:  Inspection: Mild loss of lordosis Motion: Flexion 40 deg, Extension 25 deg, Side Bending to 35 deg bilaterally,  Rotation to 45 deg bilaterally  SLR laying: Negative  XSLR laying: Negative  Palpable tenderness: Positive tenderness to palpation of the paraspinal musculature. FABER: Tightness bilaterally right greater than left. Sensory change: Gross sensation intact to all lumbar and sacral dermatomes.  Reflexes: 2+ at both patellar tendons, 2+ at achilles tendons, Babinski's downgoing.  Strength at foot  Plantar-flexion: 5/5 Dorsi-flexion: 5/5 Eversion: 5/5 Inversion: 5/5  Leg strength  Quad: 5/5 Hamstring: 5/5 Hip flexor: 5/5 Hip abductors: 5/5  Gait unremarkable.  Osteopathic findings C6 flexed rotated and side bent left T3 extended rotated and side bent right inhaled third rib T9 extended rotated and side bent left L2 flexed rotated and side bent right Sacrum right on right    Impression and Recommendations:     This case required medical decision making of moderate complexity. The above documentation has been reviewed and is accurate and complete Judi Saa, DO       Note: This dictation was prepared with Dragon dictation along with smaller phrase technology. Any transcriptional errors that result from this process are unintentional.

## 2018-11-07 ENCOUNTER — Encounter: Payer: Self-pay | Admitting: Family Medicine

## 2018-11-07 ENCOUNTER — Ambulatory Visit: Payer: 59 | Admitting: Family Medicine

## 2018-11-07 ENCOUNTER — Other Ambulatory Visit (INDEPENDENT_AMBULATORY_CARE_PROVIDER_SITE_OTHER): Payer: 59

## 2018-11-07 VITALS — BP 122/92 | HR 60 | Ht 65.0 in | Wt 181.0 lb

## 2018-11-07 DIAGNOSIS — M2550 Pain in unspecified joint: Secondary | ICD-10-CM

## 2018-11-07 DIAGNOSIS — M999 Biomechanical lesion, unspecified: Secondary | ICD-10-CM | POA: Diagnosis not present

## 2018-11-07 DIAGNOSIS — M9904 Segmental and somatic dysfunction of sacral region: Secondary | ICD-10-CM

## 2018-11-07 DIAGNOSIS — M791 Myalgia, unspecified site: Secondary | ICD-10-CM | POA: Diagnosis not present

## 2018-11-07 DIAGNOSIS — Z13220 Encounter for screening for lipoid disorders: Secondary | ICD-10-CM | POA: Diagnosis not present

## 2018-11-07 DIAGNOSIS — Z0000 Encounter for general adult medical examination without abnormal findings: Secondary | ICD-10-CM

## 2018-11-07 LAB — LIPID PANEL
Cholesterol: 243 mg/dL — ABNORMAL HIGH (ref 0–200)
HDL: 45.1 mg/dL (ref >39.00)
LDL Cholesterol: 159 mg/dL — ABNORMAL HIGH (ref 0–99)
NonHDL: 198.17
Total CHOL/HDL Ratio: 5
Triglycerides: 197 mg/dL — ABNORMAL HIGH (ref 0.0–149.0)
VLDL: 39.4 mg/dL (ref 0.0–40.0)

## 2018-11-07 LAB — COMPREHENSIVE METABOLIC PANEL
ALT: 14 U/L (ref 0–35)
AST: 14 U/L (ref 0–37)
Albumin: 4.5 g/dL (ref 3.5–5.2)
Alkaline Phosphatase: 58 U/L (ref 39–117)
BUN: 17 mg/dL (ref 6–23)
CO2: 27 mEq/L (ref 19–32)
Calcium: 9.3 mg/dL (ref 8.4–10.5)
Chloride: 105 mEq/L (ref 96–112)
Creatinine, Ser: 0.77 mg/dL (ref 0.40–1.20)
GFR: 93.34 mL/min (ref >60.00)
GLUCOSE: 103 mg/dL — AB (ref 70–99)
Potassium: 3.2 mEq/L — ABNORMAL LOW (ref 3.5–5.1)
Sodium: 141 mEq/L (ref 135–145)
TOTAL PROTEIN: 7.1 g/dL (ref 6.0–8.3)
Total Bilirubin: 0.5 mg/dL (ref 0.2–1.2)

## 2018-11-07 LAB — CBC WITH DIFFERENTIAL/PLATELET
Basophils Absolute: 0.1 10*3/uL (ref 0.0–0.1)
Basophils Relative: 0.9 % (ref 0.0–3.0)
EOS ABS: 0.2 10*3/uL (ref 0.0–0.7)
Eosinophils Relative: 2.4 % (ref 0.0–5.0)
HCT: 38.1 % (ref 36.0–46.0)
Hemoglobin: 12.7 g/dL (ref 12.0–15.0)
LYMPHS PCT: 32.4 % (ref 12.0–46.0)
Lymphs Abs: 2 10*3/uL (ref 0.7–4.0)
MCHC: 33.3 g/dL (ref 30.0–36.0)
MCV: 91.2 fl (ref 78.0–100.0)
Monocytes Absolute: 0.4 10*3/uL (ref 0.1–1.0)
Monocytes Relative: 6.1 % (ref 3.0–12.0)
Neutro Abs: 3.6 10*3/uL (ref 1.4–7.7)
Neutrophils Relative %: 58.2 % (ref 43.0–77.0)
Platelets: 237 10*3/uL (ref 150.0–400.0)
RBC: 4.18 Mil/uL (ref 3.87–5.11)
RDW: 13.5 % (ref 11.5–15.5)
WBC: 6.2 10*3/uL (ref 4.0–10.5)

## 2018-11-07 LAB — URINALYSIS
Bilirubin Urine: NEGATIVE
Hgb urine dipstick: NEGATIVE
Ketones, ur: NEGATIVE
Leukocytes, UA: NEGATIVE
Nitrite: NEGATIVE
Specific Gravity, Urine: 1.015 (ref 1.000–1.030)
Total Protein, Urine: NEGATIVE
URINE GLUCOSE: NEGATIVE
Urobilinogen, UA: 0.2 (ref 0.0–1.0)
pH: 7 (ref 5.0–8.0)

## 2018-11-07 LAB — URIC ACID: Uric Acid, Serum: 7.3 mg/dL — ABNORMAL HIGH (ref 2.4–7.0)

## 2018-11-07 LAB — TSH: TSH: 1.6 u[IU]/mL (ref 0.35–4.50)

## 2018-11-07 LAB — FERRITIN: Ferritin: 317.6 ng/mL — ABNORMAL HIGH (ref 10.0–291.0)

## 2018-11-07 LAB — IBC PANEL
Iron: 113 ug/dL (ref 42–145)
SATURATION RATIOS: 35.4 % (ref 20.0–50.0)
Transferrin: 228 mg/dL (ref 212.0–360.0)

## 2018-11-07 LAB — SEDIMENTATION RATE: Sed Rate: 21 mm/hr (ref 0–30)

## 2018-11-07 MED ORDER — GABAPENTIN 100 MG PO CAPS: 100.0000 mg | ORAL_CAPSULE | Freq: Three times a day (TID) | ORAL | 3 refills | Status: DC

## 2018-11-07 NOTE — Assessment & Plan Note (Signed)
Decision today to treat with OMT was based on Physical Exam  After verbal consent patient was treated with HVLA, ME, FPR techniques in  thoracic, lumbar and sacral areas  Patient tolerated the procedure well with improvement in symptoms  Patient given exercises, stretches and lifestyle modifications  See medications in patient instructions if given  Patient will follow up in 4 weeks 

## 2018-11-07 NOTE — Patient Instructions (Addendum)
Good to see you  Labs downstairs Ice is your friend Stay active Exercises 3 times a week.  Labs downstairs Gabapentin 200mg  at night Over the counter get  Vitamin D 2000 IU daily  Turmeric 500mg  daily  See me again in 4 weeks

## 2018-11-07 NOTE — Assessment & Plan Note (Signed)
Patient does have more of right sacroiliac dysfunction.  We will get laboratory work-up to make sure nothing else is contributing at this time has more of the chronic pain.  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  Patient responded well to osteopathic manipulation.  Discussed posture and ergonomics, given home exercises, gabapentin at night.  Follow-up again in 4weeks differential includes lumbar radiculopathy.

## 2018-11-09 ENCOUNTER — Other Ambulatory Visit: Payer: Self-pay | Admitting: Family

## 2018-11-09 DIAGNOSIS — E876 Hypokalemia: Secondary | ICD-10-CM

## 2018-11-09 MED ORDER — POTASSIUM CHLORIDE ER 10 MEQ PO TBCR: 10.0000 meq | EXTENDED_RELEASE_TABLET | Freq: Every day | ORAL | 0 refills | Status: DC

## 2018-11-10 LAB — ANTI-NUCLEAR AB-TITER (ANA TITER): ANA Titer 1: 1:80 {titer} — ABNORMAL HIGH

## 2018-11-10 LAB — PTH, INTACT AND CALCIUM
Calcium: 9.2 mg/dL (ref 8.6–10.4)
PTH: 47 pg/mL (ref 14–64)

## 2018-11-10 LAB — VITAMIN D 1,25 DIHYDROXY
VITAMIN D 1, 25 (OH) TOTAL: 57 pg/mL (ref 18–72)
Vitamin D2 1, 25 (OH)2: <8 pg/mL
Vitamin D3 1, 25 (OH)2: 57 pg/mL

## 2018-11-10 LAB — CALCIUM, IONIZED: Calcium, Ion: 4.97 mg/dL (ref 4.8–5.6)

## 2018-11-10 LAB — ANA: Anti Nuclear Antibody(ANA): POSITIVE — AB

## 2018-11-10 LAB — RHEUMATOID FACTOR: Rheumatoid fact SerPl-aCnc: <14 IU/mL (ref <14)

## 2018-11-23 ENCOUNTER — Ambulatory Visit
Admission: RE | Admit: 2018-11-23 | Discharge: 2018-11-23 | Disposition: A | Discharge status: Home / Self Care | Payer: 59 | Source: Ambulatory Visit | Attending: Family | Admitting: Family

## 2018-11-23 DIAGNOSIS — Z1231 Encounter for screening mammogram for malignant neoplasm of breast: Secondary | ICD-10-CM

## 2018-11-29 ENCOUNTER — Other Ambulatory Visit: Payer: Self-pay | Admitting: Family Medicine

## 2018-11-29 NOTE — Telephone Encounter (Signed)
Refill done.  

## 2019-02-03 ENCOUNTER — Other Ambulatory Visit: Payer: Self-pay | Admitting: Family Medicine

## 2019-02-06 NOTE — Telephone Encounter (Signed)
Left message for patient to call back to schedule virtual visit.  

## 2019-02-13 ENCOUNTER — Ambulatory Visit (INDEPENDENT_AMBULATORY_CARE_PROVIDER_SITE_OTHER): Payer: 59 | Admitting: Family

## 2019-02-13 DIAGNOSIS — I10 Essential (primary) hypertension: Secondary | ICD-10-CM

## 2019-02-13 DIAGNOSIS — M2550 Pain in unspecified joint: Secondary | ICD-10-CM | POA: Diagnosis not present

## 2019-02-13 NOTE — Progress Notes (Signed)
Patty FrostRhonda Dawkins Adams is a 58 y.o. female with the following history as recorded in EpicCare:  Patient Active Problem List   Diagnosis Date Noted  . Somatic dysfunction of right sacroiliac joint 11/07/2018  . Nonallopathic lesion of sacral region 11/07/2018  . Nonallopathic lesion of lumbosacral region 11/07/2018  . Nonallopathic lesion of thoracic region 11/07/2018  . Bilateral hip pain 05/05/2017  . Lateral epicondylitis of right elbow 08/17/2016  . Carpal tunnel syndrome of right wrist 02/27/2016  . Anxiety state 03/27/2015  . Right hip pain 01/10/2015  . Essential hypertension 01/10/2015    Current Outpatient Medications  Medication Sig Dispense Refill  . amLODipine-benazepril (LOTREL) 10-40 MG capsule Take 1 capsule by mouth daily. 90 capsule 3  . gabapentin (NEURONTIN) 100 MG capsule TAKE 1 CAPSULE (100 MG TOTAL) BY MOUTH 3 (THREE) TIMES DAILY. 200 MG AT NIGHT 270 capsule 1  . hydrochlorothiazide (HYDRODIURIL) 25 MG tablet Take 1 tablet (25 mg total) by mouth daily. 90 tablet 3  . potassium chloride (K-DUR) 10 MEQ tablet Take 1 tablet (10 mEq total) by mouth daily. 5 tablet 0   No current facility-administered medications for this visit.     Allergies: Patient has no known allergies.  Past Medical History:  Diagnosis Date  . Chicken pox   . Hyperlipidemia   . Hypertension   . UTI (lower urinary tract infection)     Past Surgical History:  Procedure Laterality Date  . TUBAL LIGATION      Family History  Problem Relation Age of Onset  . Hyperlipidemia Mother   . Hypertension Mother   . Dementia Mother   . Hyperlipidemia Father   . Hypertension Father   . Diabetes Father   . Diabetes Maternal Grandmother   . Hypertension Maternal Grandfather   . Hypertension Paternal Grandmother   . Diabetes Paternal Grandmother   . Breast cancer Neg Hx     Social History   Tobacco Use  . Smoking status: Never Smoker  . Smokeless tobacco: Never Used  Substance Use Topics  .  Alcohol use: Yes    Comment: occasionally    Subjective:    I connected with Patty Adams on 02/13/19 at 11:00 AM EDT by a video enabled telemedicine application and verified that I am speaking with the correct person using two identifiers.   I discussed the limitations of evaluation and management by telemedicine and the availability of in person appointments. The patient expressed understanding and agreed to proceed.  Patient wanted to discuss her lab results that were drawn by Dr. Katrinka BlazingSmith in February of this year. Notes she wanted further clarification about the ANA result; admits that she is continuing to have back pain- opted not to take the Gabapentin that was given by Dr. Katrinka BlazingSmith in February; did not understand that she was supposed to see Dr. Katrinka BlazingSmith in March;      Objective:  There were no vitals filed for this visit.  General: Well developed, well nourished, in no acute distress  Head: Normocephalic and atraumatic  Lungs: Respirations unlabored;  Neurologic: Alert and oriented; speech intact; face symmetrical;   Assessment:  1. Polyarthralgia   2. Essential hypertension     Plan:  1. Reviewed labs with patient that she did with Dr. Katrinka BlazingSmith at her visit with him in February 2020; discussed that ANA was not overly concerning but that it would be good to repeat the test and follow-up with Dr. Katrinka BlazingSmith with continued back pain; rheumatology consult could be considered.  2. Keep planned follow-up with me for 6 month check up in June 2020. Per patient, blood pressure is averaging 135/75-80.  No follow-ups on file.  No orders of the defined types were placed in this encounter.   Requested Prescriptions    No prescriptions requested or ordered in this encounter

## 2019-03-03 ENCOUNTER — Other Ambulatory Visit: Payer: Self-pay | Admitting: Family Medicine

## 2019-03-03 NOTE — Telephone Encounter (Signed)
Left message for patient to call back for a virtual visit.

## 2019-03-20 ENCOUNTER — Ambulatory Visit: Payer: 59 | Admitting: Family Medicine

## 2019-03-20 ENCOUNTER — Ambulatory Visit: Payer: 59 | Admitting: Family

## 2019-03-20 NOTE — Progress Notes (Deleted)
Patty Adams D.O. [facility] [address] [place], [place] [address] Phone: [phone] Subjective:    I'm seeing this patient by the request  of:    CC: Back pain follow-up  BJY:[id number]:Subjective  Patty FrostRhonda Dawkins Adams is a 58 y.o. female coming in with complaint of ***  Onset-  Location Duration-  Character- Aggravating factors- Reliving factors-  Therapies tried-  Severity-   Laboratory work-up did find elevated uric acid and taking over-the-counter medications.  Positive ANA at 1-80 titer.  Past Medical History:  Diagnosis Date  . Chicken pox   . Hyperlipidemia   . Hypertension   . UTI (lower urinary tract infection)    Past Surgical History:  Procedure Laterality Date  . TUBAL LIGATION     Social History   Socioeconomic History  . Marital status: Married    Spouse name: Not on file  . Number of children: 3  . Years of education: 5116  . Highest education level: Not on file  Occupational History  . 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 Financial resource strain: Not on file  . Food insecurity    Worry: Not on file    Inability: Not on file  . Transportation needs    Medical: Not on file    Non-medical: Not on file  Tobacco Use  . Smoking status: Never Smoker  . Smokeless tobacco: Never Used  Substance and Sexual Activity  . Alcohol use: Yes    Comment: occasionally  . Drug use: No  . Sexual activity: Not on file  Lifestyle  . Physical activity    Days per week: Not on file    Minutes per session: Not on file  . Stress: Not on file  Relationships  . Social Musicianconnections    Talks on phone: Not on file    Gets together: Not on file    Attends religious service: Not on file    Active member of club or organization: Not on file    Attends meetings of clubs or organizations: Not on file    Relationship status: Not on file  Other Topics Concern  . Not on file  Social History Narrative   Fun: Paint, Travel   Denies any religious  beliefs effecting health care.    Feels safe at home and denies abuse.    No Known Allergies Family History  Problem Relation Age of Onset  . Hyperlipidemia Mother   . Hypertension Mother   . Dementia Mother   . Hyperlipidemia Father   . Hypertension Father   . Diabetes Father   . Diabetes Maternal Grandmother   . Hypertension Maternal Grandfather   . Hypertension Paternal Grandmother   . Diabetes Paternal Grandmother   . Breast cancer Neg Hx      Current Outpatient Medications (Cardiovascular):  .  amLODipine-benazepril (LOTREL) 10-40 MG capsule, Take 1 capsule by mouth daily. .  hydrochlorothiazide (HYDRODIURIL) 25 MG tablet, Take 1 tablet (25 mg total) by mouth daily.     Current Outpatient Medications (Other):  .  gabapentin (NEURONTIN) 100 MG capsule, TAKE 1 CAPSULE (100 MG TOTAL) BY MOUTH 3 (THREE) TIMES DAILY. 200 MG AT NIGHT .  potassium chloride (K-DUR) 10 MEQ tablet, Take 1 tablet (10 mEq total) by mouth daily.    Past medical history, social, surgical and family history all reviewed in electronic medical record.  No pertanent information unless stated regarding to the chief complaint.   Review of Systems:  No headache, visual  changes, nausea, vomiting, diarrhea, constipation, dizziness, abdominal pain, skin rash, fevers, chills, night sweats, weight loss, swollen lymph nodes, body aches, joint swelling, muscle aches, chest pain, shortness of breath, mood changes.   Objective  There were no vitals taken for this visit. Systems examined below as of    General: No apparent distress alert and oriented x3 mood and affect normal, dressed appropriately.  HEENT: Pupils equal, extraocular movements intact  Respiratory: Patient's speak in full sentences and does not appear short of breath  Cardiovascular: No lower extremity edema, non tender, no erythema  Skin: Warm dry intact with no signs of infection or rash on extremities or on axial skeleton.  Abdomen: Soft  nontender  Neuro: Cranial nerves II through XII are intact, neurovascularly intact in all extremities with 2+ DTRs and 2+ pulses.  Lymph: No lymphadenopathy of posterior or anterior cervical chain or axillae bilaterally.  Gait normal with good balance and coordination.  MSK:  Non tender with full range of motion and good stability and symmetric strength and tone of shoulders, elbows, wrist, hip, knee and ankles bilaterally.  Back Exam:  Inspection: Unremarkable  Motion: Flexion 45 deg, Extension 45 deg, Side Bending to 45 deg bilaterally,  Rotation to 45 deg bilaterally  SLR laying: Negative  XSLR laying: Negative  Palpable tenderness: None. FABER: negative. Sensory change: Gross sensation intact to all lumbar and sacral dermatomes.  Reflexes: 2+ at both patellar tendons, 2+ at achilles tendons, Babinski's downgoing.  Strength at foot  Plantar-flexion: 5/5 Dorsi-flexion: 5/5 Eversion: 5/5 Inversion: 5/5  Leg strength  Quad: 5/5 Hamstring: 5/5 Hip flexor: 5/5 Hip abductors: 5/5  Gait unremarkable.  Osteopathic findings C2 flexed rotated and side bent right C4 flexed rotated and side bent left C6 flexed rotated and side bent left T3 extended rotated and side bent right inhaled third rib T9 extended rotated and side bent left L2 flexed rotated and side bent right Sacrum right on right    Impression and Recommendations:     This case required medical decision making of moderate complexity. The above documentation has been reviewed and is accurate and complete Lyndal Pulley, DO       Note: This dictation was prepared with Dragon dictation along with smaller phrase technology. Any transcriptional errors that result from this process are unintentional.

## 2019-04-24 ENCOUNTER — Ambulatory Visit: Payer: 59 | Admitting: Family

## 2019-05-01 ENCOUNTER — Ambulatory Visit: Payer: 59 | Admitting: Family

## 2019-05-08 ENCOUNTER — Ambulatory Visit: Payer: 59 | Admitting: Family

## 2019-05-12 ENCOUNTER — Other Ambulatory Visit: Payer: Self-pay

## 2019-05-12 ENCOUNTER — Telehealth: Payer: Self-pay | Admitting: Family

## 2019-05-12 ENCOUNTER — Other Ambulatory Visit (INDEPENDENT_AMBULATORY_CARE_PROVIDER_SITE_OTHER): Payer: 59

## 2019-05-12 ENCOUNTER — Encounter: Payer: Self-pay | Admitting: Family

## 2019-05-12 ENCOUNTER — Ambulatory Visit (INDEPENDENT_AMBULATORY_CARE_PROVIDER_SITE_OTHER): Payer: 59 | Admitting: Family

## 2019-05-12 VITALS — BP 126/88 | HR 74 | Temp 98.1°F | Ht 65.0 in | Wt 180.0 lb

## 2019-05-12 DIAGNOSIS — E559 Vitamin D deficiency, unspecified: Secondary | ICD-10-CM

## 2019-05-12 DIAGNOSIS — I10 Essential (primary) hypertension: Secondary | ICD-10-CM

## 2019-05-12 DIAGNOSIS — M79671 Pain in right foot: Secondary | ICD-10-CM

## 2019-05-12 LAB — COMPREHENSIVE METABOLIC PANEL
ALT: 11 U/L (ref 0–35)
AST: 12 U/L (ref 0–37)
Albumin: 4.6 g/dL (ref 3.5–5.2)
Alkaline Phosphatase: 66 U/L (ref 39–117)
BUN: 17 mg/dL (ref 6–23)
CO2: 28 mEq/L (ref 19–32)
Calcium: 9.8 mg/dL (ref 8.4–10.5)
Chloride: 101 mEq/L (ref 96–112)
Creatinine, Ser: 0.78 mg/dL (ref 0.40–1.20)
GFR: 91.8 mL/min (ref >60.00)
Glucose, Bld: 100 mg/dL — ABNORMAL HIGH (ref 70–99)
Potassium: 2.8 mEq/L — CL (ref 3.5–5.1)
Sodium: 141 mEq/L (ref 135–145)
Total Bilirubin: 0.4 mg/dL (ref 0.2–1.2)
Total Protein: 7.6 g/dL (ref 6.0–8.3)

## 2019-05-12 LAB — VITAMIN D 25 HYDROXY (VIT D DEFICIENCY, FRACTURES): VITD: 18.8 ng/mL — ABNORMAL LOW (ref 30.00–100.00)

## 2019-05-12 LAB — URIC ACID: Uric Acid, Serum: 8 mg/dL — ABNORMAL HIGH (ref 2.4–7.0)

## 2019-05-12 MED ORDER — POTASSIUM CHLORIDE CRYS ER 20 MEQ PO TBCR: EXTENDED_RELEASE_TABLET | ORAL | 0 refills | Status: DC

## 2019-05-12 NOTE — Telephone Encounter (Signed)
Please let her know that she needs to take potassium 20 meq bid x 5 days and then decrease to daily; will need to re-check in 1 week; let me get the rest of her labs and we will call her on Monday to make a definite follow-up.

## 2019-05-12 NOTE — Progress Notes (Signed)
Patty Adams is a 58 y.o. female with the following history as recorded in EpicCare:  Patient Active Problem List   Diagnosis Date Noted  . Somatic dysfunction of right sacroiliac joint 11/07/2018  . Nonallopathic lesion of sacral region 11/07/2018  . Nonallopathic lesion of lumbosacral region 11/07/2018  . Nonallopathic lesion of thoracic region 11/07/2018  . Bilateral hip pain 05/05/2017  . Lateral epicondylitis of right elbow 08/17/2016  . Carpal tunnel syndrome of right wrist 02/27/2016  . Anxiety state 03/27/2015  . Right hip pain 01/10/2015  . Essential hypertension 01/10/2015    Current Outpatient Medications  Medication Sig Dispense Refill  . amLODipine-benazepril (LOTREL) 10-40 MG capsule Take 1 capsule by mouth daily. 90 capsule 3  . hydrochlorothiazide (HYDRODIURIL) 25 MG tablet Take 1 tablet (25 mg total) by mouth daily. 90 tablet 3   No current facility-administered medications for this visit.     Allergies: Patient has no known allergies.  Past Medical History:  Diagnosis Date  . Chicken pox   . Hyperlipidemia   . Hypertension   . UTI (lower urinary tract infection)     Past Surgical History:  Procedure Laterality Date  . TUBAL LIGATION      Family History  Problem Relation Age of Onset  . Hyperlipidemia Mother   . Hypertension Mother   . Dementia Mother   . Hyperlipidemia Father   . Hypertension Father   . Diabetes Father   . Diabetes Maternal Grandmother   . Hypertension Maternal Grandfather   . Hypertension Paternal Grandmother   . Diabetes Paternal Grandmother   . Breast cancer Neg Hx     Social History   Tobacco Use  . Smoking status: Never Smoker  . Smokeless tobacco: Never Used  Substance Use Topics  . Alcohol use: Yes    Comment: occasionally    Subjective:  Follow-up on hypertension/ elevated uric acid level; continuing to have chronic problems with joint pains- last uric acid in February was at 7.3; did not understand to start  tart cherry extract; Is checking blood pressure at home- in general averaging 130/75-85; Denies any chest pain, shortness of breath, blurred vision or headache.    Objective:  Vitals:   05/12/19 1341  BP: 126/88  Pulse: 74  Temp: 98.1 F (36.7 C)  TempSrc: Oral  SpO2: 98%  Weight: 180 lb 0.6 oz (81.7 kg)  Height: 5' 5"  (1.651 m)    General: Well developed, well nourished, in no acute distress  Skin : Warm and dry.  Head: Normocephalic and atraumatic  Lungs: Respirations unlabored; clear to auscultation bilaterally without wheeze, rales, rhonchi  CVS exam: normal rate and regular rhythm.  Neurologic: Alert and oriented; speech intact; face symmetrical; moves all extremities well; CNII-XII intact without focal deficit   Assessment:  1. Essential hypertension   2. Vitamin D deficiency   3. Right foot pain     Plan:  1. Stable; continue same medications; 2. Check vitamin D level; 3. ? Uncontrolled gout; recent uric acid level was elevated; re-check labs today and follow-up to be determined.    No follow-ups on file.  Orders Placed This Encounter  Procedures  . Vitamin D (25 hydroxy)    Standing Status:   Future    Number of Occurrences:   1    Standing Expiration Date:   05/11/2020  . Comp Met (CMET)    Standing Status:   Future    Number of Occurrences:   1    Standing  Expiration Date:   05/11/2020  . Uric acid    Standing Status:   Future    Number of Occurrences:   1    Standing Expiration Date:   05/11/2020    Requested Prescriptions    No prescriptions requested or ordered in this encounter

## 2019-05-12 NOTE — Telephone Encounter (Signed)
Lab called to report a critical level:  Potassium - 2.8

## 2019-05-12 NOTE — Telephone Encounter (Signed)
Spoke with patient and info given 

## 2019-05-15 ENCOUNTER — Other Ambulatory Visit: Payer: Self-pay | Admitting: Family

## 2019-05-15 DIAGNOSIS — E876 Hypokalemia: Secondary | ICD-10-CM

## 2019-05-15 MED ORDER — ALLOPURINOL 100 MG PO TABS: 100.0000 mg | ORAL_TABLET | Freq: Every day | ORAL | 2 refills | Status: DC

## 2019-05-15 MED ORDER — SPIRONOLACTONE 25 MG PO TABS: 25.0000 mg | ORAL_TABLET | Freq: Every day | ORAL | 2 refills | Status: DC

## 2019-05-15 MED ORDER — VITAMIN D (ERGOCALCIFEROL) 1.25 MG (50000 UNIT) PO CAPS: 50000.0000 [IU] | ORAL_CAPSULE | ORAL | 0 refills | Status: DC

## 2019-05-17 ENCOUNTER — Other Ambulatory Visit (INDEPENDENT_AMBULATORY_CARE_PROVIDER_SITE_OTHER): Payer: 59

## 2019-05-17 DIAGNOSIS — E876 Hypokalemia: Secondary | ICD-10-CM

## 2019-05-17 LAB — BASIC METABOLIC PANEL
BUN: 9 mg/dL (ref 6–23)
CO2: 26 mEq/L (ref 19–32)
Calcium: 9.5 mg/dL (ref 8.4–10.5)
Chloride: 104 mEq/L (ref 96–112)
Creatinine, Ser: 0.75 mg/dL (ref 0.40–1.20)
GFR: 96.05 mL/min (ref >60.00)
Glucose, Bld: 112 mg/dL — ABNORMAL HIGH (ref 70–99)
Potassium: 3.5 mEq/L (ref 3.5–5.1)
Sodium: 140 mEq/L (ref 135–145)

## 2019-06-16 ENCOUNTER — Ambulatory Visit: Payer: 59 | Admitting: Family

## 2019-06-23 ENCOUNTER — Ambulatory Visit (INDEPENDENT_AMBULATORY_CARE_PROVIDER_SITE_OTHER)
Admission: RE | Admit: 2019-06-23 | Discharge: 2019-06-23 | Disposition: A | Discharge status: Home / Self Care | Payer: 59 | Source: Ambulatory Visit | Attending: Family | Admitting: Family

## 2019-06-23 ENCOUNTER — Other Ambulatory Visit (INDEPENDENT_AMBULATORY_CARE_PROVIDER_SITE_OTHER): Payer: 59

## 2019-06-23 ENCOUNTER — Other Ambulatory Visit: Payer: Self-pay

## 2019-06-23 ENCOUNTER — Encounter: Payer: Self-pay | Admitting: Family

## 2019-06-23 ENCOUNTER — Ambulatory Visit (INDEPENDENT_AMBULATORY_CARE_PROVIDER_SITE_OTHER): Payer: 59 | Admitting: Family

## 2019-06-23 VITALS — BP 130/82 | HR 71 | Temp 98.4°F | Ht 65.0 in | Wt 180.1 lb

## 2019-06-23 DIAGNOSIS — M545 Low back pain, unspecified: Secondary | ICD-10-CM

## 2019-06-23 DIAGNOSIS — E559 Vitamin D deficiency, unspecified: Secondary | ICD-10-CM | POA: Diagnosis not present

## 2019-06-23 DIAGNOSIS — G8929 Other chronic pain: Secondary | ICD-10-CM

## 2019-06-23 DIAGNOSIS — E790 Hyperuricemia without signs of inflammatory arthritis and tophaceous disease: Secondary | ICD-10-CM

## 2019-06-23 DIAGNOSIS — R252 Cramp and spasm: Secondary | ICD-10-CM | POA: Diagnosis not present

## 2019-06-23 DIAGNOSIS — Z23 Encounter for immunization: Secondary | ICD-10-CM | POA: Diagnosis not present

## 2019-06-23 DIAGNOSIS — I10 Essential (primary) hypertension: Secondary | ICD-10-CM

## 2019-06-23 LAB — COMPREHENSIVE METABOLIC PANEL
ALT: 13 U/L (ref 0–35)
AST: 13 U/L (ref 0–37)
Albumin: 4.5 g/dL (ref 3.5–5.2)
Alkaline Phosphatase: 68 U/L (ref 39–117)
BUN: 16 mg/dL (ref 6–23)
CO2: 25 mEq/L (ref 19–32)
Calcium: 9.6 mg/dL (ref 8.4–10.5)
Chloride: 107 mEq/L (ref 96–112)
Creatinine, Ser: 0.88 mg/dL (ref 0.40–1.20)
GFR: 79.84 mL/min (ref >60.00)
Glucose, Bld: 101 mg/dL — ABNORMAL HIGH (ref 70–99)
Potassium: 3.7 mEq/L (ref 3.5–5.1)
Sodium: 141 mEq/L (ref 135–145)
Total Bilirubin: 0.4 mg/dL (ref 0.2–1.2)
Total Protein: 7.6 g/dL (ref 6.0–8.3)

## 2019-06-23 LAB — VITAMIN D 25 HYDROXY (VIT D DEFICIENCY, FRACTURES): VITD: 21.35 ng/mL — ABNORMAL LOW (ref 30.00–100.00)

## 2019-06-23 LAB — VITAMIN B12: Vitamin B-12: 477 pg/mL (ref 211–911)

## 2019-06-23 LAB — URIC ACID: Uric Acid, Serum: 5.5 mg/dL (ref 2.4–7.0)

## 2019-06-23 LAB — MAGNESIUM: Magnesium: 2.1 mg/dL (ref 1.5–2.5)

## 2019-06-23 MED ORDER — MELOXICAM 15 MG PO TABS: 15.0000 mg | ORAL_TABLET | Freq: Every day | ORAL | 0 refills | Status: DC

## 2019-06-23 MED ORDER — AMLODIPINE BESY-BENAZEPRIL HCL 10-40 MG PO CAPS: 1.0000 | ORAL_CAPSULE | Freq: Every day | ORAL | 3 refills | Status: DC

## 2019-06-23 MED ORDER — SPIRONOLACTONE 25 MG PO TABS: 25.0000 mg | ORAL_TABLET | Freq: Every day | ORAL | 3 refills | Status: DC

## 2019-06-23 NOTE — Progress Notes (Signed)
Patty Adams is a 58 y.o. female with the following history as recorded in EpicCare:  Patient Active Problem List   Diagnosis Date Noted  . Somatic dysfunction of right sacroiliac joint 11/07/2018  . Nonallopathic lesion of sacral region 11/07/2018  . Nonallopathic lesion of lumbosacral region 11/07/2018  . Nonallopathic lesion of thoracic region 11/07/2018  . Bilateral hip pain 05/05/2017  . Lateral epicondylitis of right elbow 08/17/2016  . Carpal tunnel syndrome of right wrist 02/27/2016  . Anxiety state 03/27/2015  . Right hip pain 01/10/2015  . Essential hypertension 01/10/2015    Current Outpatient Medications  Medication Sig Dispense Refill  . allopurinol (ZYLOPRIM) 100 MG tablet Take 1 tablet (100 mg total) by mouth daily. 30 tablet 2  . amLODipine-benazepril (LOTREL) 10-40 MG capsule Take 1 capsule by mouth daily. 90 capsule 3  . spironolactone (ALDACTONE) 25 MG tablet Take 1 tablet (25 mg total) by mouth daily. 90 tablet 3  . Vitamin D, Ergocalciferol, (DRISDOL) 1.25 MG (50000 UT) CAPS capsule Take 1 capsule (50,000 Units total) by mouth every 7 (seven) days for 12 doses. 12 capsule 0  . meloxicam (MOBIC) 15 MG tablet Take 1 tablet (15 mg total) by mouth daily. 30 tablet 0   No current facility-administered medications for this visit.     Allergies: Patient has no known allergies.  Past Medical History:  Diagnosis Date  . Chicken pox   . Hyperlipidemia   . Hypertension   . UTI (lower urinary tract infection)     Past Surgical History:  Procedure Laterality Date  . TUBAL LIGATION      Family History  Problem Relation Age of Onset  . Hyperlipidemia Mother   . Hypertension Mother   . Dementia Mother   . Hyperlipidemia Father   . Hypertension Father   . Diabetes Father   . Diabetes Maternal Grandmother   . Hypertension Maternal Grandfather   . Hypertension Paternal Grandmother   . Diabetes Paternal Grandmother   . Breast cancer Neg Hx     Social  History   Tobacco Use  . Smoking status: Never Smoker  . Smokeless tobacco: Never Used  Substance Use Topics  . Alcohol use: Yes    Comment: occasionally    Subjective:  Follow-up on hypertension/ hypokalemia/ new onset gout; tolerating combination of Lotrel and Spironolactone well; Denies any chest pain, shortness of breath, blurred vision or headache. No personal or family history of gout but tolerating Allopurinol well;  Continuing with chronic low back pain- radiating down into right lower leg; X-ray done in 2019 did show arthritis changes; MRI was recommended but patient did not understand to call back to schedule;    Objective:  Vitals:   06/23/19 1352  BP: 130/82  Pulse: 71  Temp: 98.4 F (36.9 C)  TempSrc: Oral  SpO2: 99%  Weight: 180 lb 1.3 oz (81.7 kg)  Height: _0  (1.651 m)    General: Well developed, well nourished, in no acute distress  Skin : Warm and dry.  Head: Normocephalic and atraumatic  Eyes: Sclera and conjunctiva clear; pupils round and reactive to light; extraocular movements intact  Ears: External normal; canals clear; tympanic membranes normal  Oropharynx: Pink, supple. No suspicious lesions  Neck: Supple without thyromegaly, adenopathy  Lungs: Respirations unlabored; clear to auscultation bilaterally without wheeze, rales, rhonchi  CVS exam: normal rate and regular rhythm.  Musculoskeletal: No deformities; no active joint inflammation  Extremities: No edema, cyanosis, clubbing  Vessels: Symmetric bilaterally  Neurologic:  Alert and oriented; speech intact; face symmetrical; moves all extremities well; CNII-XII intact without focal deficit   Assessment:  1. Essential hypertension   2. Elevated blood uric acid level   3. Leg cramps   4. Chronic low back pain, unspecified back pain laterality, unspecified whether sciatica present   5. Vitamin D deficiency   6. Flu vaccine need     Plan:  1. Stable; continue same medications; check CMP  today; 2. Check uric acid level today; will adjust Allopurinol as needed; 3. Check B12, magnesium today; 4. Repeat lumbar X-ray; will plan to update MRI; suspect she will benefit from steroid injections. 5. Check Vitamin D level today; 6. Flu shot updated;   No follow-ups on file.  Orders Placed This Encounter  Procedures  . DG Lumbar Spine 2-3 Views    Standing Status:   Future    Number of Occurrences:   1    Standing Expiration Date:   08/22/2020    Order Specific Question:   Reason for Exam (SYMPTOM  OR DIAGNOSIS REQUIRED)    Answer:   chronic low back pain    Order Specific Question:   Is patient pregnant?    Answer:   No    Order Specific Question:   Preferred imaging location?    Answer:   [facility]    Order Specific Question:   Radiology Contrast Protocol - do NOT remove file path    Answer:   [email or web address]  . Flu Vaccine QUAD 36+ mos IM  . Comp Met (CMET)    Standing Status:   Future    Number of Occurrences:   1    Standing Expiration Date:   06/22/2020  . Magnesium    Standing Status:   Future    Number of Occurrences:   1    Standing Expiration Date:   06/22/2020  . B12    Standing Status:   Future    Number of Occurrences:   1    Standing Expiration Date:   06/22/2020  . Uric acid    Standing Status:   Future    Number of Occurrences:   1    Standing Expiration Date:   06/22/2020  . Vitamin D (25 hydroxy)    Standing Status:   Future    Number of Occurrences:   1    Standing Expiration Date:   06/22/2020    Requested Prescriptions   Signed Prescriptions Disp Refills  . amLODipine-benazepril (LOTREL) 10-40 MG capsule 90 capsule 3    Sig: Take 1 capsule by mouth daily.  Marland Kitchen spironolactone (ALDACTONE) 25 MG tablet 90 tablet 3    Sig: Take 1 tablet (25 mg total) by mouth daily.  . meloxicam (MOBIC) 15 MG tablet 30 tablet 0    Sig: Take 1 tablet (15 mg total) by mouth daily.

## 2019-06-26 ENCOUNTER — Other Ambulatory Visit: Payer: Self-pay | Admitting: Family

## 2019-06-26 DIAGNOSIS — M5440 Lumbago with sciatica, unspecified side: Secondary | ICD-10-CM

## 2019-06-26 DIAGNOSIS — G8929 Other chronic pain: Secondary | ICD-10-CM

## 2019-06-26 MED ORDER — VITAMIN D (ERGOCALCIFEROL) 1.25 MG (50000 UNIT) PO CAPS: 50000.0000 [IU] | ORAL_CAPSULE | ORAL | 0 refills | Status: AC

## 2019-06-26 MED ORDER — ALLOPURINOL 100 MG PO TABS: 100.0000 mg | ORAL_TABLET | Freq: Every day | ORAL | 2 refills | Status: DC

## 2019-07-18 ENCOUNTER — Other Ambulatory Visit: Payer: Self-pay | Admitting: Family

## 2019-07-22 ENCOUNTER — Other Ambulatory Visit: Payer: Self-pay

## 2019-07-22 ENCOUNTER — Ambulatory Visit
Admission: RE | Admit: 2019-07-22 | Discharge: 2019-07-22 | Disposition: A | Discharge status: Home / Self Care | Payer: 59 | Source: Ambulatory Visit | Attending: Family | Admitting: Family

## 2019-07-22 DIAGNOSIS — M5440 Lumbago with sciatica, unspecified side: Secondary | ICD-10-CM

## 2019-07-22 DIAGNOSIS — G8929 Other chronic pain: Secondary | ICD-10-CM

## 2019-07-24 ENCOUNTER — Other Ambulatory Visit: Payer: Self-pay | Admitting: Family

## 2019-07-24 DIAGNOSIS — M5126 Other intervertebral disc displacement, lumbar region: Secondary | ICD-10-CM

## 2019-07-24 DIAGNOSIS — M5416 Radiculopathy, lumbar region: Secondary | ICD-10-CM

## 2019-09-04 ENCOUNTER — Encounter: Payer: Self-pay | Admitting: Family

## 2019-09-25 ENCOUNTER — Other Ambulatory Visit: Payer: Self-pay | Admitting: Family

## 2019-09-25 MED ORDER — MELOXICAM 15 MG PO TABS: 15.0000 mg | ORAL_TABLET | Freq: Every day | ORAL | 1 refills | Status: DC

## 2019-10-05 IMAGING — MG DIGITAL SCREENING BILATERAL MAMMOGRAM WITH CAD
4 series · 4 of 4 positions shown · non-contrast
Comparison: Previous exam(s).

CLINICAL DATA: Screening.

EXAM:
DIGITAL SCREENING BILATERAL MAMMOGRAM WITH CAD

[R MLO]
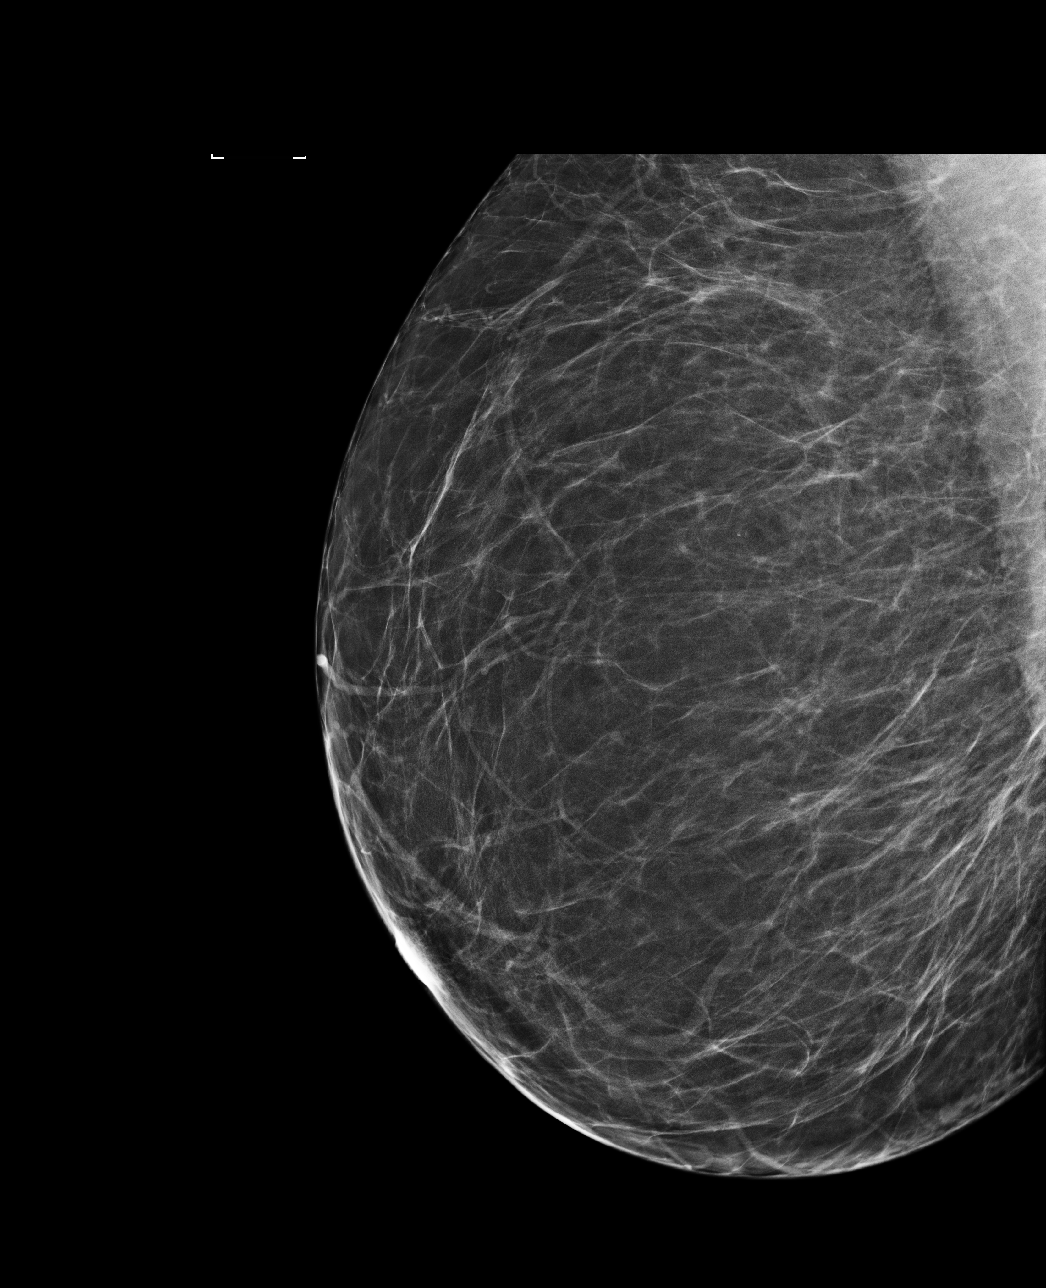

[L CC]
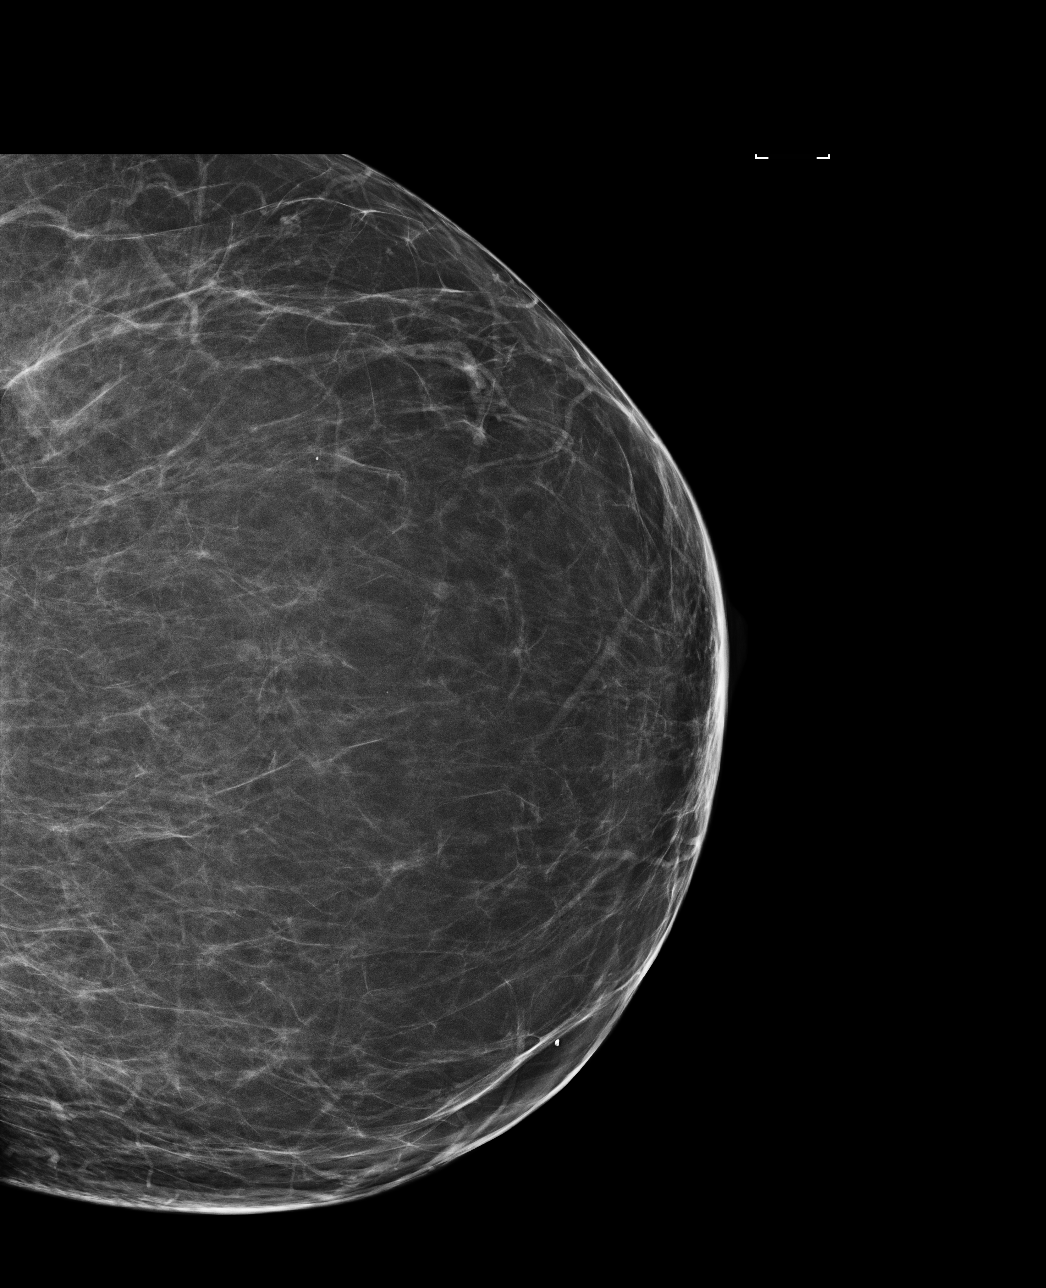

[R CC]
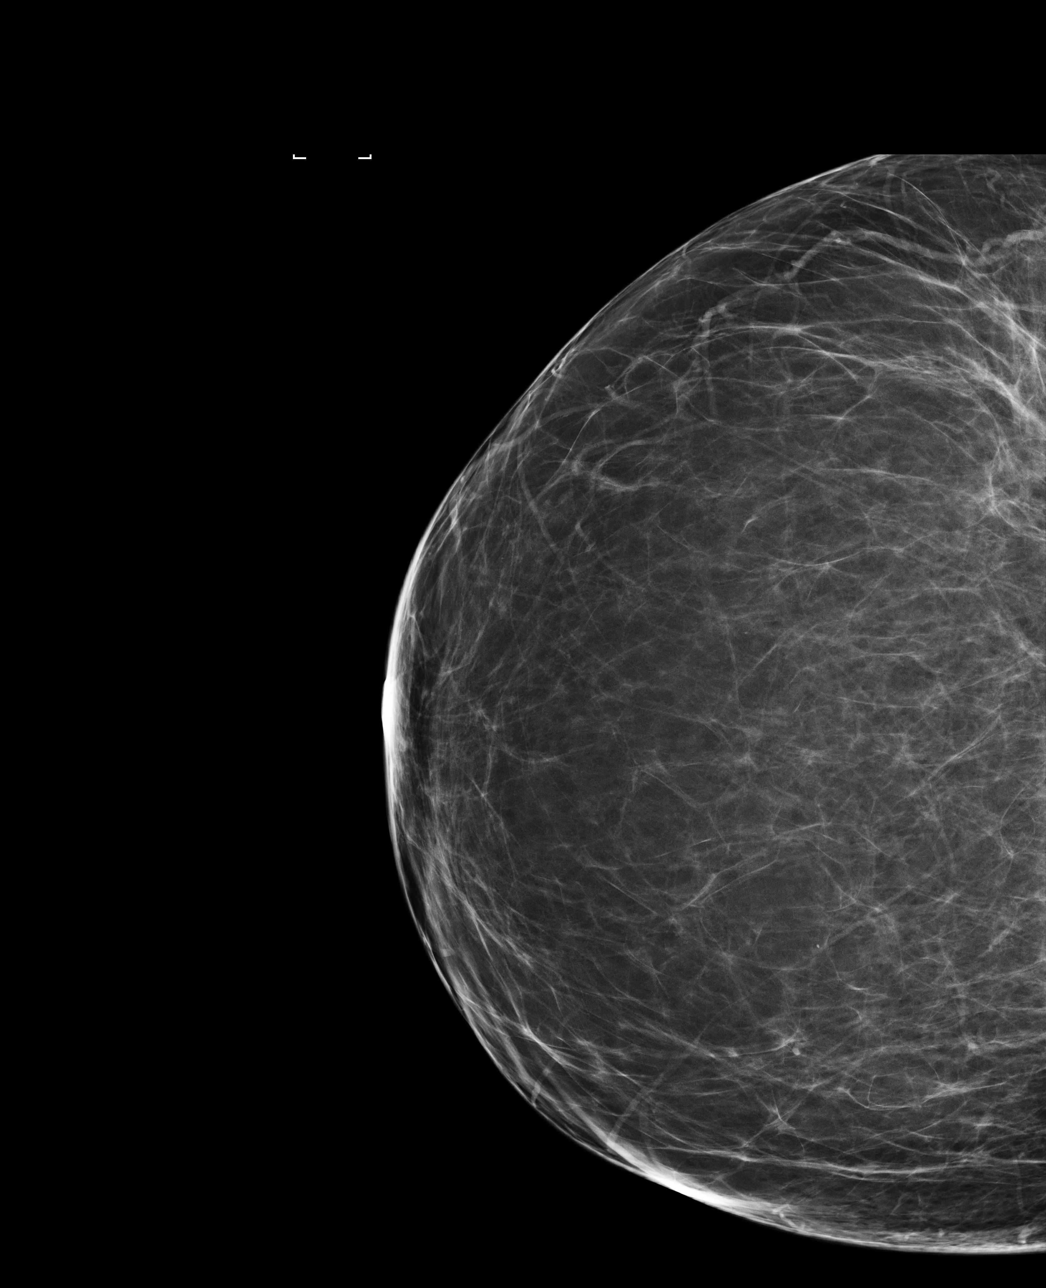

[L MLO]
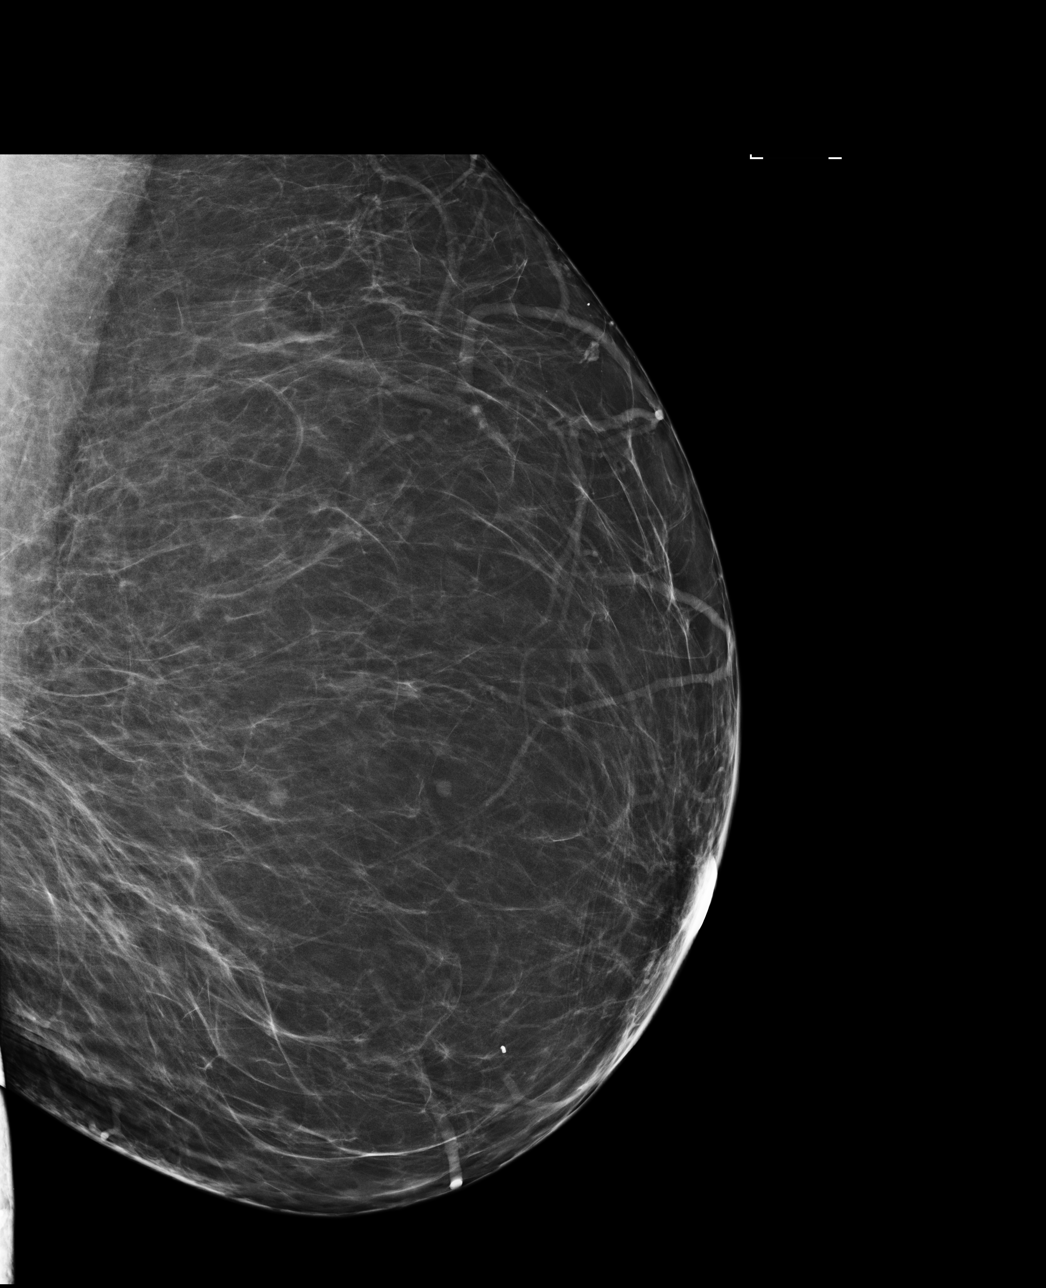

[4 of 4 positions shown; findings below may reference images not displayed]

ACR Breast Density Category b: There are scattered areas of
fibroglandular density.
FINDINGS: There are no findings suspicious for malignancy. Images were
processed with CAD.
IMPRESSION: No mammographic evidence of malignancy. A result letter of this
screening mammogram will be mailed directly to the patient.

RECOMMENDATION:
Screening mammogram in one year. (Code:AS-G-LCT)

BI-RADS CATEGORY  1: Negative.

## 2019-12-09 ENCOUNTER — Ambulatory Visit: Payer: 59

## 2019-12-09 ENCOUNTER — Ambulatory Visit: Payer: 59 | Attending: Internal Medicine

## 2019-12-09 DIAGNOSIS — Z23 Encounter for immunization: Secondary | ICD-10-CM | POA: Insufficient documentation

## 2019-12-09 NOTE — Progress Notes (Signed)
   Covid-19 Vaccination Clinic  Name:  Creedence Heiss    MRN: [id number] DOB: [birth year]  12/09/2019  Ms. Riester was observed post Covid-19 immunization for 15 minutes without incident. She was provided with Vaccine Information Sheet and instruction to access the V-Safe system.   Ms. Kitchen was instructed to call 911 with any severe reactions post vaccine: Marland Kitchen Difficulty breathing  . Swelling of face and throat  . A fast heartbeat  . A bad rash all over body  . Dizziness and weakness   Immunizations Administered    Name Date Dose VIS Date Route   Pfizer COVID-19 Vaccine 12/09/2019 12:20 PM 0.3 mL 09/15/2019 Intramuscular   Manufacturer: [employer], [employer]   Lot: [id number]   NDC: [address]-2

## 2019-12-25 ENCOUNTER — Other Ambulatory Visit: Payer: Self-pay | Admitting: Family

## 2019-12-25 DIAGNOSIS — Z1231 Encounter for screening mammogram for malignant neoplasm of breast: Secondary | ICD-10-CM

## 2019-12-28 ENCOUNTER — Other Ambulatory Visit: Payer: Self-pay

## 2019-12-28 ENCOUNTER — Ambulatory Visit
Admission: RE | Admit: 2019-12-28 | Discharge: 2019-12-28 | Disposition: A | Discharge status: Home / Self Care | Payer: 59 | Source: Ambulatory Visit

## 2019-12-28 DIAGNOSIS — Z1231 Encounter for screening mammogram for malignant neoplasm of breast: Secondary | ICD-10-CM

## 2019-12-30 ENCOUNTER — Ambulatory Visit: Payer: 59 | Attending: Internal Medicine

## 2019-12-30 DIAGNOSIS — Z23 Encounter for immunization: Secondary | ICD-10-CM

## 2019-12-30 NOTE — Progress Notes (Signed)
   Covid-19 Vaccination Clinic  Name:  Patty Adams    MRN: [id number] DOB: 04-05-1961  12/30/2019  [name] was observed post Covid-19 immunization for 15 minutes without incident. She was provided with Vaccine Information Sheet and instruction to access the V-Safe system.   [name] was instructed to call 911 with any severe reactions post vaccine: Marland Kitchen Difficulty breathing  . Swelling of face and throat  . A fast heartbeat  . A bad rash all over body  . Dizziness and weakness   Immunizations Administered    Name Date Dose VIS Date Route   Pfizer COVID-19 Vaccine 12/30/2019  8:21 AM 0.3 mL 09/15/2019 Intramuscular   Manufacturer: [employer], [employer]   Lot: [id number]   NDC: [address]-7

## 2020-01-03 ENCOUNTER — Telehealth: Payer: 59 | Admitting: Family

## 2020-01-03 DIAGNOSIS — H1013 Acute atopic conjunctivitis, bilateral: Secondary | ICD-10-CM | POA: Diagnosis not present

## 2020-01-03 MED ORDER — POLYMYXIN B-TRIMETHOPRIM 10000-0.1 UNIT/ML-% OP SOLN: 1.0000 [drp] | Freq: Four times a day (QID) | OPHTHALMIC | 0 refills | Status: DC

## 2020-01-03 NOTE — Progress Notes (Signed)
E-Visit for [employer]   We are sorry that you are not feeling well.  Here is how we plan to help!  Based on what you have shared with me it looks like you have conjunctivitis.  Conjunctivitis is a common inflammatory or infectious condition of the eye that is often referred to as "pink eye".  In most cases it is contagious (viral or bacterial). However, not all conjunctivitis requires antibiotics (ex. Allergic).  We have made appropriate suggestions for you based upon your presentation.  I recommend that you use OpconA, 1-2 drops every 4-6 hours (an over the counter allergy drop available at your local pharmacy).  Your pharmacist may have an alternative suggestion. Also to start a daily zyrtec 10 mg. I recommend you change your current contacts.     Strict handwashing is suggested with soap and water is urged.  If not available, use alcohol based had sanitizer.  Avoid unnecessary touching of the eye.  If you wear contact lenses, you will need to refrain from wearing them until you see no white discharge from the eye for at least 24 hours after being on medication.  You should see symptom improvement in 1-2 days after starting the medication regimen.  Call us if symptoms are not improved in 1-2 days.  Home Care:  Wash your hands often!  Do not wear your contacts until you complete your treatment plan.  Avoid sharing towels, bed linen, personal items with a person who has pink eye.  See attention for anyone in your home with similar symptoms.  Get Help Right Away If:  Your symptoms do not improve.  You develop blurred or loss of vision.  Your symptoms worsen (increased discharge, pain or redness)  Your e-visit answers were reviewed by a board certified advanced clinical practitioner to complete your personal care plan.  Depending on the condition, your plan could have included both over the counter or prescription medications.  If there is a problem please reply  once you have received a  response from your provider.  Your safety is important to [place].  If you have drug allergies check your prescription carefully.    You can use MyChart to ask questions about today's visit, request a non-urgent call back, or ask for a work or school excuse for 24 hours related to this e-Visit. If it has been greater than 24 hours you will need to follow up with your provider, or enter a new e-Visit to address those concerns.   You will get an e-mail in the next two days asking about your experience.  I hope that your e-visit has been valuable and will speed your recovery. Thank you for using e-visits.   Approximately 5 minutes was spent documenting and reviewing patient's chart.

## 2020-01-03 NOTE — Addendum Note (Signed)
Addended by: Jannifer Rodney A on: 01/03/2020 12:34 PM   Modules accepted: Orders

## 2020-01-09 ENCOUNTER — Ambulatory Visit: Payer: 59

## 2020-07-09 ENCOUNTER — Other Ambulatory Visit: Payer: Self-pay | Admitting: Family

## 2020-07-09 MED ORDER — ALLOPURINOL 100 MG PO TABS: 100.0000 mg | ORAL_TABLET | Freq: Every day | ORAL | 1 refills | Status: DC

## 2020-07-12 ENCOUNTER — Other Ambulatory Visit: Payer: Self-pay | Admitting: Family

## 2020-07-12 MED ORDER — SPIRONOLACTONE 25 MG PO TABS: 25.0000 mg | ORAL_TABLET | Freq: Every day | ORAL | 0 refills | Status: DC

## 2020-08-05 ENCOUNTER — Other Ambulatory Visit: Payer: Self-pay | Admitting: Family

## 2020-08-05 MED ORDER — AMLODIPINE BESY-BENAZEPRIL HCL 10-40 MG PO CAPS: 1.0000 | ORAL_CAPSULE | Freq: Every day | ORAL | 0 refills | Status: DC

## 2020-10-06 ENCOUNTER — Other Ambulatory Visit: Payer: Self-pay | Admitting: Family

## 2020-11-06 ENCOUNTER — Other Ambulatory Visit: Payer: Self-pay | Admitting: Family

## 2020-11-08 IMAGING — MG DIGITAL SCREENING BILAT W/ TOMO W/ CAD
8 series · 8 of 24 positions shown · non-contrast
Comparison: Previous exam(s).

CLINICAL DATA: Screening.

EXAM:
DIGITAL SCREENING BILATERAL MAMMOGRAM WITH TOMO AND CAD

[L CC synth-2D]
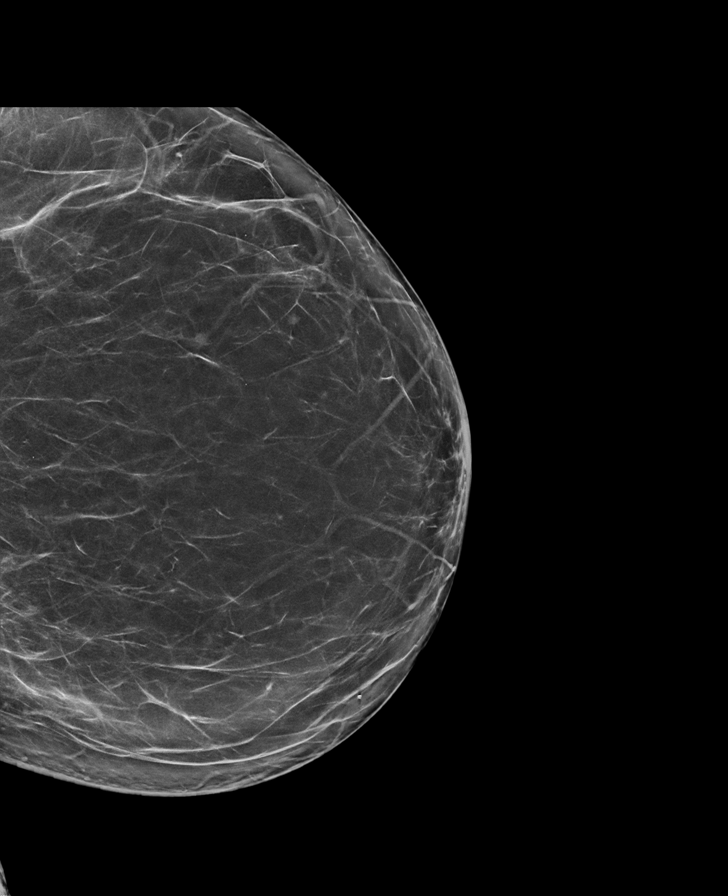

[R CC synth-2D]
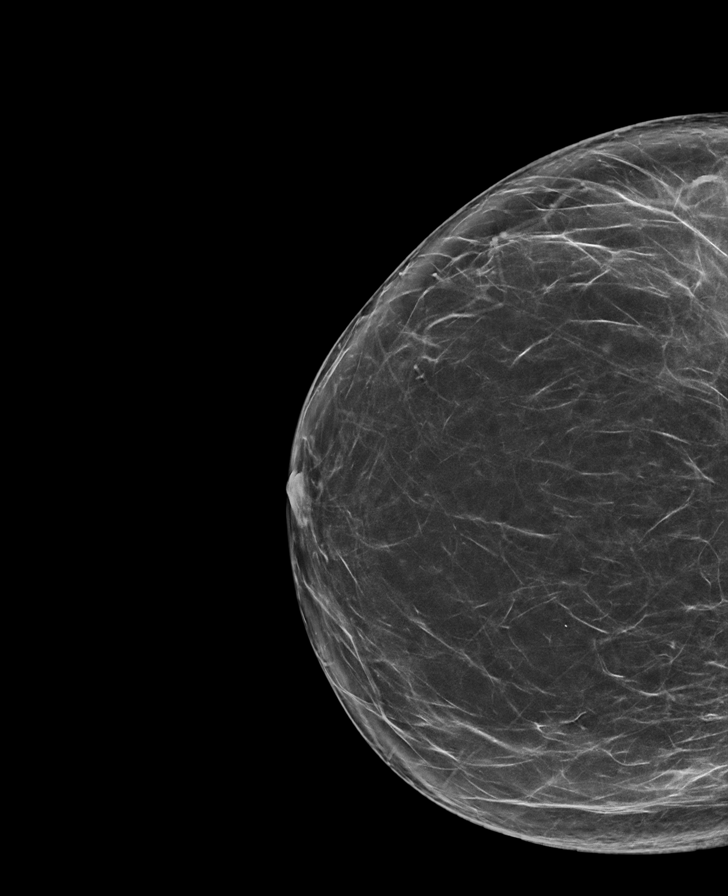

[R MLO synth-2D]
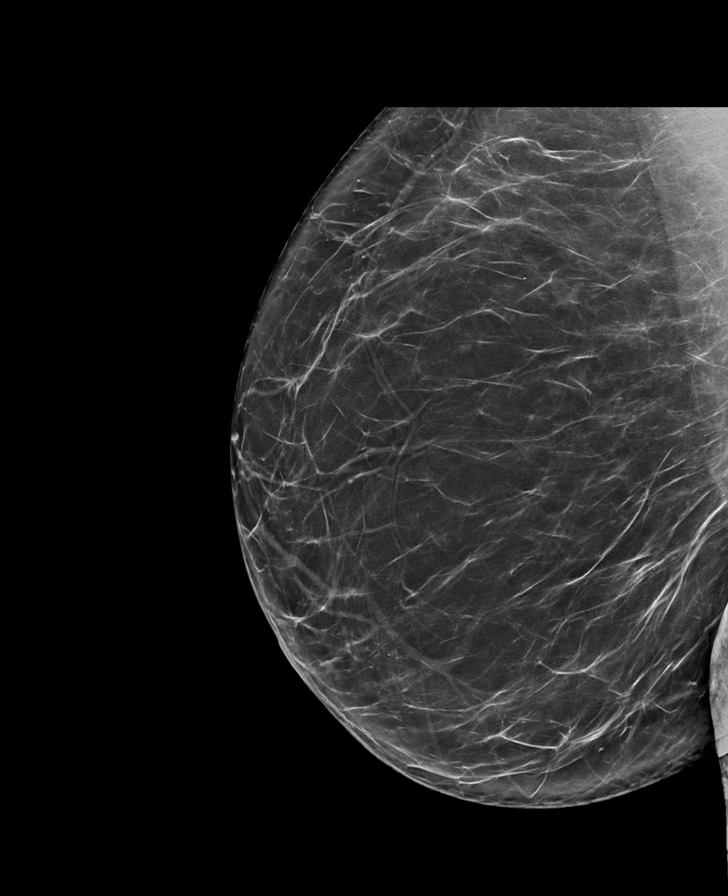

[L MLO synth-2D]
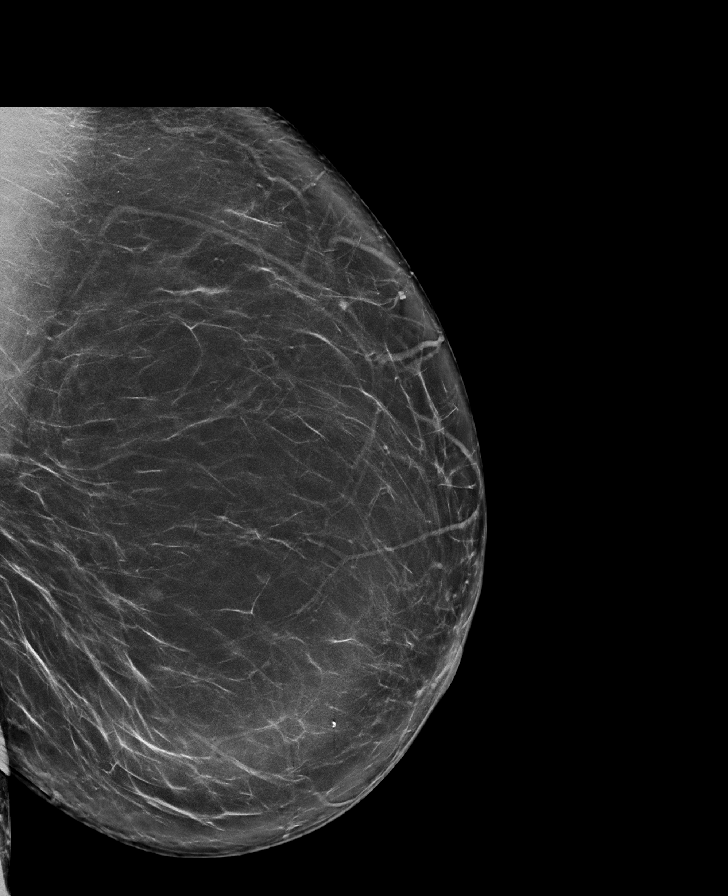

[R MLO tomo · tomo slice 45/88.0]
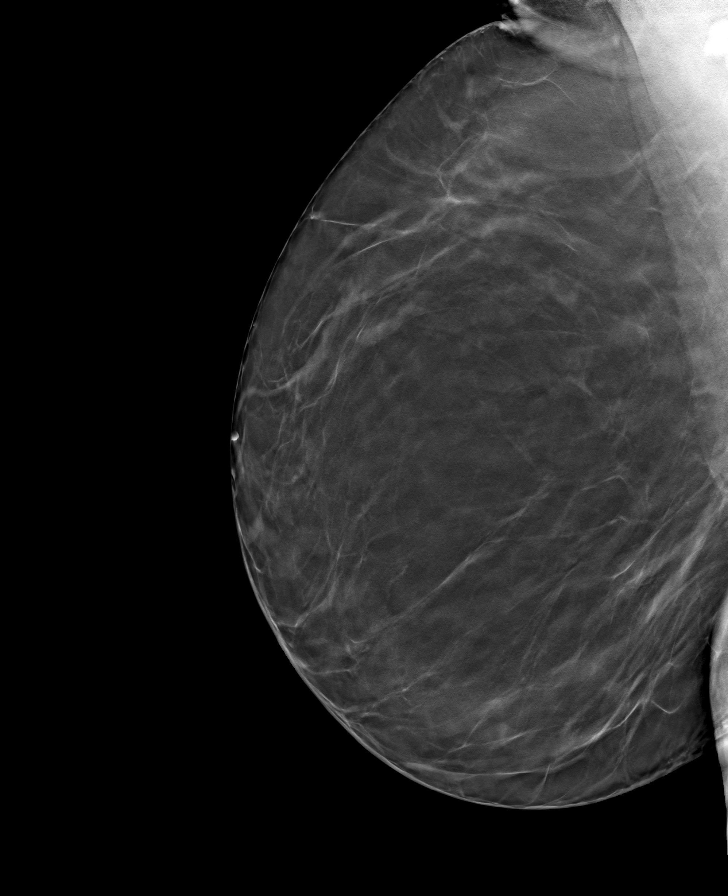

[R CC tomo · tomo slice 43/86.0]
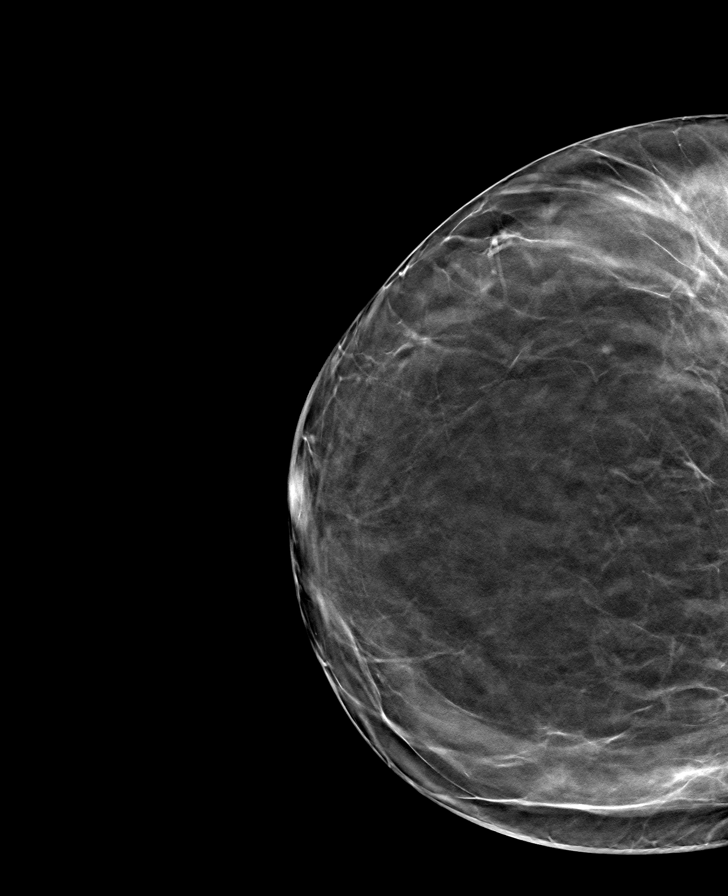

[L CC tomo · tomo slice 44/87.0]
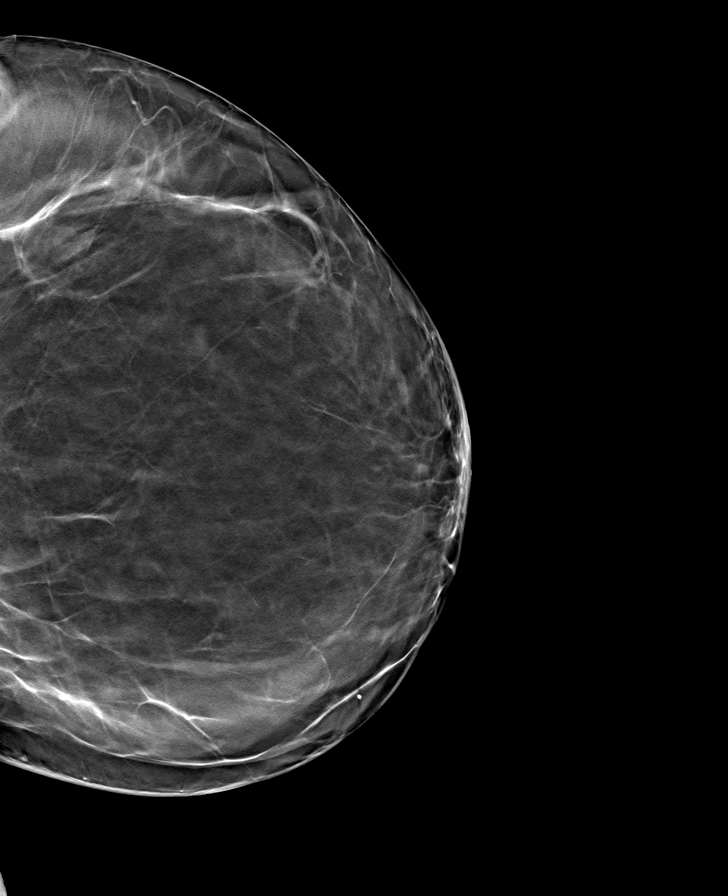

[L MLO tomo · tomo slice 47/92.0]
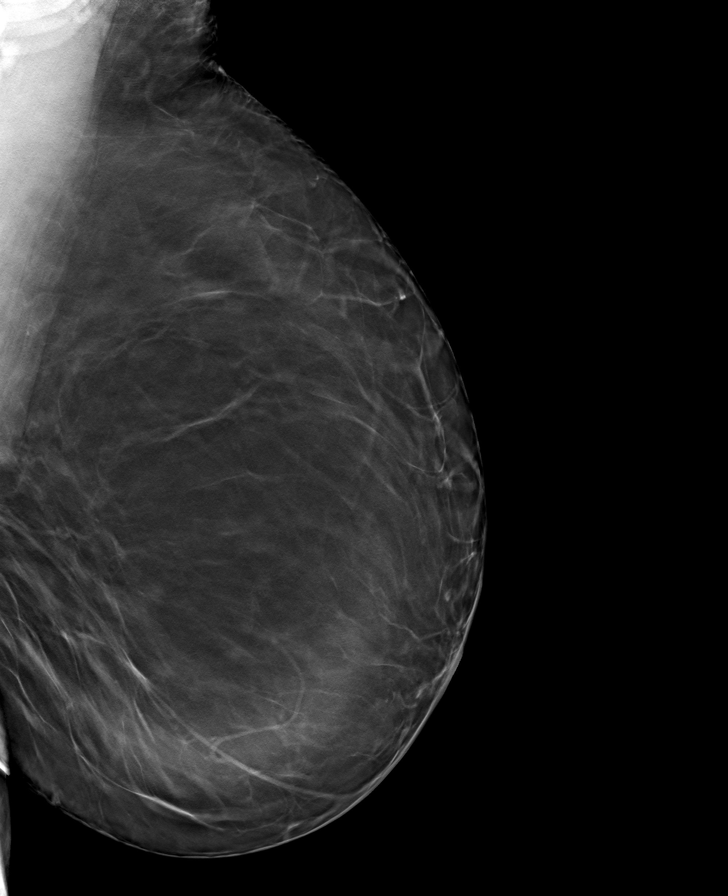

[8 of 24 positions shown; findings below may reference images not displayed]

ACR Breast Density Category b: There are scattered areas of
fibroglandular density.
FINDINGS: There are no findings suspicious for malignancy. Images were
processed with CAD.
IMPRESSION: No mammographic evidence of malignancy. A result letter of this
screening mammogram will be mailed directly to the patient.

RECOMMENDATION:
Screening mammogram in one year. (Code:[id number])

BI-RADS CATEGORY  1: Negative.

## 2020-12-23 ENCOUNTER — Other Ambulatory Visit: Payer: Self-pay | Admitting: Family

## 2020-12-23 ENCOUNTER — Telehealth: Payer: Self-pay | Admitting: Family

## 2020-12-23 MED ORDER — AMLODIPINE BESY-BENAZEPRIL HCL 10-40 MG PO CAPS: 1.0000 | ORAL_CAPSULE | Freq: Every day | ORAL | 0 refills | Status: DC

## 2020-12-23 MED ORDER — SPIRONOLACTONE 25 MG PO TABS: 25.0000 mg | ORAL_TABLET | Freq: Every day | ORAL | 0 refills | Status: DC

## 2020-12-23 NOTE — Telephone Encounter (Signed)
Patient requesting short supply refill until 4/8 appointment amLODipine-benazepril (LOTREL) 10-40 MG capsule spironolactone (ALDACTONE) 25 MG tablet  Pharmacy [id number] - [place], [place] - [address]

## 2020-12-23 NOTE — Telephone Encounter (Signed)
I have given pt a curtsy refill of both medications. She must keep OV for additional refills.

## 2021-01-07 ENCOUNTER — Other Ambulatory Visit: Payer: Self-pay | Admitting: Family

## 2021-01-09 ENCOUNTER — Other Ambulatory Visit: Payer: Self-pay

## 2021-01-10 ENCOUNTER — Encounter: Payer: Self-pay | Admitting: Family

## 2021-01-10 ENCOUNTER — Ambulatory Visit (INDEPENDENT_AMBULATORY_CARE_PROVIDER_SITE_OTHER): Payer: 59 | Admitting: Family

## 2021-01-10 ENCOUNTER — Other Ambulatory Visit: Payer: Self-pay

## 2021-01-10 VITALS — BP 156/80 | HR 81 | Temp 98.6°F | Ht 65.0 in | Wt 172.0 lb

## 2021-01-10 DIAGNOSIS — R252 Cramp and spasm: Secondary | ICD-10-CM

## 2021-01-10 DIAGNOSIS — Z0000 Encounter for general adult medical examination without abnormal findings: Secondary | ICD-10-CM | POA: Diagnosis not present

## 2021-01-10 DIAGNOSIS — M545 Low back pain, unspecified: Secondary | ICD-10-CM

## 2021-01-10 DIAGNOSIS — Z13220 Encounter for screening for lipoid disorders: Secondary | ICD-10-CM

## 2021-01-10 DIAGNOSIS — G8929 Other chronic pain: Secondary | ICD-10-CM

## 2021-01-10 DIAGNOSIS — Z1231 Encounter for screening mammogram for malignant neoplasm of breast: Secondary | ICD-10-CM

## 2021-01-10 DIAGNOSIS — I10 Essential (primary) hypertension: Secondary | ICD-10-CM

## 2021-01-10 LAB — CBC WITH DIFFERENTIAL/PLATELET
Basophils Absolute: 0 10*3/uL (ref 0.0–0.1)
Basophils Relative: 0.4 % (ref 0.0–3.0)
Eosinophils Absolute: 0.1 10*3/uL (ref 0.0–0.7)
Eosinophils Relative: 1.7 % (ref 0.0–5.0)
HCT: 38.7 % (ref 36.0–46.0)
Hemoglobin: 12.6 g/dL (ref 12.0–15.0)
Lymphocytes Relative: 24 % (ref 12.0–46.0)
Lymphs Abs: 1.8 10*3/uL (ref 0.7–4.0)
MCHC: 32.6 g/dL (ref 30.0–36.0)
MCV: 93.4 fl (ref 78.0–100.0)
Monocytes Absolute: 0.4 10*3/uL (ref 0.1–1.0)
Monocytes Relative: 4.9 % (ref 3.0–12.0)
Neutro Abs: 5.2 10*3/uL (ref 1.4–7.7)
Neutrophils Relative %: 69 % (ref 43.0–77.0)
Platelets: 241 10*3/uL (ref 150.0–400.0)
RBC: 4.14 Mil/uL (ref 3.87–5.11)
RDW: 13.1 % (ref 11.5–15.5)
WBC: 7.6 10*3/uL (ref 4.0–10.5)

## 2021-01-10 LAB — COMPREHENSIVE METABOLIC PANEL
ALT: 12 U/L (ref 0–35)
AST: 15 U/L (ref 0–37)
Albumin: 4.4 g/dL (ref 3.5–5.2)
Alkaline Phosphatase: 68 U/L (ref 39–117)
BUN: 17 mg/dL (ref 6–23)
CO2: 26 mEq/L (ref 19–32)
Calcium: 9.6 mg/dL (ref 8.4–10.5)
Chloride: 105 mEq/L (ref 96–112)
Creatinine, Ser: 0.94 mg/dL (ref 0.40–1.20)
GFR: 66.36 mL/min (ref >60.00)
Glucose, Bld: 94 mg/dL (ref 70–99)
Potassium: 3.9 mEq/L (ref 3.5–5.1)
Sodium: 138 mEq/L (ref 135–145)
Total Bilirubin: 0.3 mg/dL (ref 0.2–1.2)
Total Protein: 7.5 g/dL (ref 6.0–8.3)

## 2021-01-10 LAB — LIPID PANEL
Cholesterol: 210 mg/dL — ABNORMAL HIGH (ref 0–200)
HDL: 43.7 mg/dL (ref >39.00)
LDL Cholesterol: 136 mg/dL — ABNORMAL HIGH (ref 0–99)
NonHDL: 166.31
Total CHOL/HDL Ratio: 5
Triglycerides: 151 mg/dL — ABNORMAL HIGH (ref 0.0–149.0)
VLDL: 30.2 mg/dL (ref 0.0–40.0)

## 2021-01-10 LAB — TSH: TSH: 1.09 u[IU]/mL (ref 0.35–4.50)

## 2021-01-10 LAB — VITAMIN B12: Vitamin B-12: 638 pg/mL (ref 211–911)

## 2021-01-10 LAB — MAGNESIUM: Magnesium: 2 mg/dL (ref 1.5–2.5)

## 2021-01-10 MED ORDER — LORAZEPAM 0.5 MG PO TABS: 0.5000 mg | ORAL_TABLET | Freq: Two times a day (BID) | ORAL | 1 refills | Status: DC | PRN

## 2021-01-10 NOTE — Progress Notes (Signed)
Patty Adams is a 60 y.o. female with the following history as recorded in EpicCare:  Patient Active Problem List   Diagnosis Date Noted  . Somatic dysfunction of right sacroiliac joint 11/07/2018  . Nonallopathic lesion of sacral region 11/07/2018  . Nonallopathic lesion of lumbosacral region 11/07/2018  . Nonallopathic lesion of thoracic region 11/07/2018  . Bilateral hip pain 05/05/2017  . Lateral epicondylitis of right elbow 08/17/2016  . Carpal tunnel syndrome of right wrist 02/27/2016  . Anxiety state 03/27/2015  . Right hip pain 01/10/2015  . Essential hypertension 01/10/2015    Current Outpatient Medications  Medication Sig Dispense Refill  . allopurinol (ZYLOPRIM) 100 MG tablet TAKE 1 TABLET BY MOUTH EVERY DAY 90 tablet 0  . amLODipine-benazepril (LOTREL) 10-40 MG capsule TAKE 1 CAPSULE BY MOUTH EVERY DAY 90 capsule 0  . LORazepam (ATIVAN) 0.5 MG tablet Take 1 tablet (0.5 mg total) by mouth 2 (two) times daily as needed for anxiety. 30 tablet 1  . meloxicam (MOBIC) 15 MG tablet Take 1 tablet (15 mg total) by mouth daily. 30 tablet 1  . spironolactone (ALDACTONE) 25 MG tablet Take 1 tablet (25 mg total) by mouth daily. 30 tablet 0   No current facility-administered medications for this visit.    Allergies: Patient has no known allergies.  Past Medical History:  Diagnosis Date  . Chicken pox   . Hyperlipidemia   . Hypertension   . UTI (lower urinary tract infection)     Past Surgical History:  Procedure Laterality Date  . TUBAL LIGATION      Family History  Problem Relation Age of Onset  . Hyperlipidemia Mother   . Hypertension Mother   . Dementia Mother   . Hyperlipidemia Father   . Hypertension Father   . Diabetes Father   . Diabetes Maternal Grandmother   . Hypertension Maternal Grandfather   . Hypertension Paternal Grandmother   . Diabetes Paternal Grandmother   . Breast cancer Neg Hx     Social History   Tobacco Use  . Smoking status: Never  Smoker  . Smokeless tobacco: Never Used  Substance Use Topics  . Alcohol use: Yes    Comment: occasionally    Subjective:   Presents for yearly CPE-  Patient presents for follow up on HTN; has not been seen since 06/2019; notes that her blood pressure has been elevated on her readings for the past 7-8 months; is taking medications regularly;   Admits that stress level is very high- helping to care for her mother with dementia;   Health Maintenance  Topic Date Due  . COVID-19 Vaccine (3 - Booster for Pfizer series) 07/01/2020  . INFLUENZA VACCINE  05/05/2021  . PAP SMEAR-Modifier  10/24/2021  . MAMMOGRAM  12/27/2021  . COLONOSCOPY (Pts 45-6yr Insurance coverage will need to be confirmed)  05/06/2023  . TETANUS/TDAP  12/08/2025  . Hepatitis C Screening  Completed  . HIV Screening  Completed  . HPV VACCINES  Aged Out     Objective:  Vitals:   01/10/21 1314  BP: (!) 156/80  Pulse: 81  Temp: 98.6 F (37 C)  TempSrc: Oral  SpO2: 99%  Weight: 172 lb (78 kg)  Height: _0  (1.651 m)    General: Well developed, well nourished, in no acute distress  Skin : Warm and dry.  Head: Normocephalic and atraumatic  Eyes: Sclera and conjunctiva clear; pupils round and reactive to light; extraocular movements intact  Ears: External normal; canals clear; tympanic  membranes normal  Oropharynx: Pink, supple. No suspicious lesions  Neck: Supple without thyromegaly, adenopathy  Lungs: Respirations unlabored; clear to auscultation bilaterally without wheeze, rales, rhonchi  CVS exam: normal rate and regular rhythm.  Abdomen: Soft; nontender; nondistended; normoactive bowel sounds; no masses or hepatosplenomegaly  Musculoskeletal: No deformities; no active joint inflammation  Extremities: No edema, cyanosis, clubbing  Vessels: Symmetric bilaterally  Neurologic: Alert and oriented; speech intact; face symmetrical; moves all extremities well; CNII-XII intact without focal deficit    Assessment:  1. PE (physical exam), annual   2. Screening mammogram for breast cancer   3. Chronic low back pain, unspecified back pain laterality, unspecified whether sciatica present   4. Lipid screening   5. Muscle cramps   6. Essential hypertension     Plan:  Age appropriate preventive healthcare needs addressed; encouraged regular eye doctor and dental exams; encouraged regular exercise; will update labs and refills as needed today; follow-up to be determined; Order for mammogram updated; Refer to orthopedist for further evaluation; ? If anxiety is causing blood pressure to be elevated- encouraged to call EAP; Plan to follow-up in 1 month;  This visit occurred during the SARS-CoV-2 public health emergency.  Safety protocols were in place, including screening questions prior to the visit, additional usage of staff PPE, and extensive cleaning of exam room while observing appropriate contact time as indicated for disinfecting solutions.       Return in about 1 month (around 02/09/2021).  Orders Placed This Encounter  Procedures  . MM Digital Screening    Standing Status:   Future    Standing Expiration Date:   01/10/2022    Order Specific Question:   Reason for Exam (SYMPTOM  OR DIAGNOSIS REQUIRED)    Answer:   screening mammogram    Order Specific Question:   Is the patient pregnant?    Answer:   No    Order Specific Question:   Preferred imaging location?    Answer:   [facility]  . CBC with Differential/Platelet    Standing Status:   Future    Number of Occurrences:   1    Standing Expiration Date:   01/10/2022  . Comp Met (CMET)    Standing Status:   Future    Number of Occurrences:   1    Standing Expiration Date:   01/10/2022  . Lipid panel    Standing Status:   Future    Number of Occurrences:   1    Standing Expiration Date:   01/10/2022  . TSH    Standing Status:   Future    Number of Occurrences:   1    Standing Expiration Date:   01/10/2022  . Vitamin B12     Standing Status:   Future    Number of Occurrences:   1    Standing Expiration Date:   01/10/2022  . Magnesium    Standing Status:   Future    Number of Occurrences:   1    Standing Expiration Date:   01/10/2022  . Ambulatory referral to Orthopedic Surgery    Referral Priority:   Routine    Referral Type:   Surgical    Referral Reason:   Specialty Services Required    Requested Specialty:   Orthopedic Surgery    Number of Visits Requested:   1    Requested Prescriptions   Signed Prescriptions Disp Refills  . LORazepam (ATIVAN) 0.5 MG tablet 30 tablet 1  Sig: Take 1 tablet (0.5 mg total) by mouth 2 (two) times daily as needed for anxiety.

## 2021-01-14 ENCOUNTER — Other Ambulatory Visit: Payer: Self-pay | Admitting: Family

## 2021-02-05 ENCOUNTER — Other Ambulatory Visit: Payer: Self-pay | Admitting: Orthopedic Surgery

## 2021-02-05 DIAGNOSIS — M545 Low back pain, unspecified: Secondary | ICD-10-CM

## 2021-02-07 ENCOUNTER — Ambulatory Visit: Payer: 59 | Admitting: Family

## 2021-02-27 ENCOUNTER — Ambulatory Visit
Admission: RE | Admit: 2021-02-27 | Discharge: 2021-02-27 | Disposition: A | Discharge status: Home / Self Care | Payer: 59 | Source: Ambulatory Visit | Attending: Orthopedic Surgery | Admitting: Orthopedic Surgery

## 2021-02-27 DIAGNOSIS — M545 Low back pain, unspecified: Secondary | ICD-10-CM

## 2021-03-20 ENCOUNTER — Other Ambulatory Visit: Payer: Self-pay | Admitting: Family

## 2021-03-21 ENCOUNTER — Other Ambulatory Visit: Payer: Self-pay | Admitting: Family

## 2021-03-21 ENCOUNTER — Telehealth: Payer: Self-pay | Admitting: Family

## 2021-03-21 NOTE — Telephone Encounter (Signed)
I have attempted to call pt and there was no answer so Ieft a message to call back and relayed the message from provider on her VM.

## 2021-03-21 NOTE — Telephone Encounter (Signed)
I have attempted to reach pt with no success once again. There was no answer so I left a message to call back.

## 2021-03-21 NOTE — Telephone Encounter (Signed)
Please remind her she is due for follow up. If it doesn't work for OV right now, can she let me know how her blood pressure is doing?

## 2021-03-24 NOTE — Telephone Encounter (Signed)
I have attempted to call the pt once again no answer. I have left a message to call back.

## 2021-04-16 ENCOUNTER — Other Ambulatory Visit: Payer: Self-pay

## 2021-04-16 ENCOUNTER — Ambulatory Visit
Admission: RE | Admit: 2021-04-16 | Discharge: 2021-04-16 | Disposition: A | Discharge status: Home / Self Care | Payer: 59 | Source: Ambulatory Visit | Attending: Family | Admitting: Family

## 2021-04-16 ENCOUNTER — Other Ambulatory Visit: Payer: Self-pay | Admitting: Family

## 2021-04-16 DIAGNOSIS — Z1231 Encounter for screening mammogram for malignant neoplasm of breast: Secondary | ICD-10-CM

## 2021-05-20 ENCOUNTER — Other Ambulatory Visit: Payer: Self-pay | Admitting: Family

## 2021-05-20 ENCOUNTER — Telehealth: Payer: Self-pay | Admitting: Family

## 2021-05-20 NOTE — Telephone Encounter (Signed)
I have attempted to call pt with no success. I have left a message to call back and sent a pt an unable to reach letter.   We have send in a last 30 day curtsy refill for pt.

## 2021-05-20 NOTE — Telephone Encounter (Signed)
Due for OV to follow up on blood pressure. I know you called her multiple times in June with no response- can you mail a letter this time?

## 2021-05-20 NOTE — Progress Notes (Signed)
I have attempted to call pt no answer I have left a message toto call back and sent an unable to reach letter.

## 2021-06-03 ENCOUNTER — Other Ambulatory Visit: Payer: Self-pay | Admitting: Family

## 2021-06-26 ENCOUNTER — Other Ambulatory Visit: Payer: Self-pay | Admitting: Family

## 2021-07-19 ENCOUNTER — Other Ambulatory Visit: Payer: Self-pay | Admitting: Family

## 2021-07-22 ENCOUNTER — Other Ambulatory Visit: Payer: Self-pay | Admitting: Family

## 2021-08-15 ENCOUNTER — Other Ambulatory Visit: Payer: Self-pay | Admitting: Family

## 2021-08-18 ENCOUNTER — Other Ambulatory Visit: Payer: Self-pay | Admitting: Family

## 2021-08-18 MED ORDER — AMLODIPINE BESY-BENAZEPRIL HCL 10-40 MG PO CAPS: 1.0000 | ORAL_CAPSULE | Freq: Every day | ORAL | 0 refills | Status: DC

## 2021-08-18 MED ORDER — SPIRONOLACTONE 25 MG PO TABS: 25.0000 mg | ORAL_TABLET | Freq: Every day | ORAL | 0 refills | Status: DC

## 2021-09-03 ENCOUNTER — Other Ambulatory Visit: Payer: Self-pay | Admitting: Family

## 2021-09-03 MED ORDER — SPIRONOLACTONE 25 MG PO TABS: 25.0000 mg | ORAL_TABLET | Freq: Every day | ORAL | 0 refills | Status: DC

## 2021-09-05 ENCOUNTER — Other Ambulatory Visit: Payer: Self-pay | Admitting: Family

## 2021-09-08 ENCOUNTER — Other Ambulatory Visit: Payer: Self-pay | Admitting: Family

## 2021-09-10 ENCOUNTER — Other Ambulatory Visit: Payer: Self-pay | Admitting: Family

## 2021-09-25 ENCOUNTER — Other Ambulatory Visit: Payer: Self-pay | Admitting: Family

## 2021-09-25 ENCOUNTER — Other Ambulatory Visit: Payer: Self-pay | Admitting: Internal Medicine

## 2021-09-25 MED ORDER — SPIRONOLACTONE 25 MG PO TABS: 25.0000 mg | ORAL_TABLET | Freq: Every day | ORAL | 0 refills | Status: DC

## 2021-09-25 MED ORDER — AMLODIPINE BESY-BENAZEPRIL HCL 10-40 MG PO CAPS: 1.0000 | ORAL_CAPSULE | Freq: Every day | ORAL | 0 refills | Status: DC

## 2021-10-03 ENCOUNTER — Encounter: Payer: Self-pay | Admitting: Family

## 2021-10-03 ENCOUNTER — Ambulatory Visit (INDEPENDENT_AMBULATORY_CARE_PROVIDER_SITE_OTHER): Payer: 59 | Admitting: Family

## 2021-10-03 VITALS — BP 166/82 | HR 88 | Temp 98.4°F | Ht 65.0 in | Wt 174.2 lb

## 2021-10-03 DIAGNOSIS — I10 Essential (primary) hypertension: Secondary | ICD-10-CM | POA: Diagnosis not present

## 2021-10-03 DIAGNOSIS — F418 Other specified anxiety disorders: Secondary | ICD-10-CM | POA: Diagnosis not present

## 2021-10-03 DIAGNOSIS — H6121 Impacted cerumen, right ear: Secondary | ICD-10-CM

## 2021-10-03 DIAGNOSIS — E790 Hyperuricemia without signs of inflammatory arthritis and tophaceous disease: Secondary | ICD-10-CM | POA: Diagnosis not present

## 2021-10-03 MED ORDER — LORAZEPAM 0.5 MG PO TABS: 0.5000 mg | ORAL_TABLET | Freq: Two times a day (BID) | ORAL | 1 refills | Status: DC | PRN

## 2021-10-03 MED ORDER — SPIRONOLACTONE 25 MG PO TABS: 25.0000 mg | ORAL_TABLET | Freq: Every day | ORAL | 3 refills | Status: DC

## 2021-10-03 MED ORDER — AMLODIPINE BESY-BENAZEPRIL HCL 10-40 MG PO CAPS: 1.0000 | ORAL_CAPSULE | Freq: Every day | ORAL | 2 refills | Status: DC

## 2021-10-03 NOTE — Progress Notes (Signed)
Patty Adams is a 60 y.o. female with the following history as recorded in EpicCare:  Patient Active Problem List   Diagnosis Date Noted   Somatic dysfunction of right sacroiliac joint 11/07/2018   Nonallopathic lesion of sacral region 11/07/2018   Nonallopathic lesion of lumbosacral region 11/07/2018   Nonallopathic lesion of thoracic region 11/07/2018   Bilateral hip pain 05/05/2017   Lateral epicondylitis of right elbow 08/17/2016   Carpal tunnel syndrome of right wrist 02/27/2016   Anxiety state 03/27/2015   Right hip pain 01/10/2015   Essential hypertension 01/10/2015    Current Outpatient Medications  Medication Sig Dispense Refill   allopurinol (ZYLOPRIM) 100 MG tablet Take 1 tablet (100 mg total) by mouth daily. Must make appointment for further refills (Patient not taking: Reported on 10/03/2021) 30 tablet 0   amLODipine-benazepril (LOTREL) 10-40 MG capsule Take 1 capsule by mouth daily. 90 capsule 2   LORazepam (ATIVAN) 0.5 MG tablet Take 1 tablet (0.5 mg total) by mouth 2 (two) times daily as needed for anxiety. 30 tablet 1   meloxicam (MOBIC) 15 MG tablet Take 1 tablet (15 mg total) by mouth daily. (Patient not taking: Reported on 10/03/2021) 30 tablet 1   spironolactone (ALDACTONE) 25 MG tablet Take 1 tablet (25 mg total) by mouth daily. 90 tablet 3   No current facility-administered medications for this visit.    Allergies: Patient has no known allergies.  Past Medical History:  Diagnosis Date   Chicken pox    Hyperlipidemia    Hypertension    UTI (lower urinary tract infection)     Past Surgical History:  Procedure Laterality Date   TUBAL LIGATION      Family History  Problem Relation Age of Onset   Hyperlipidemia Mother    Hypertension Mother    Dementia Mother    Hyperlipidemia Father    Hypertension Father    Diabetes Father    Diabetes Maternal Grandmother    Hypertension Maternal Grandfather    Hypertension Paternal Grandmother    Diabetes  Paternal Grandmother    Breast cancer Neg Hx     Social History   Tobacco Use   Smoking status: Never   Smokeless tobacco: Never  Substance Use Topics   Alcohol use: Yes    Comment: occasionally    Subjective:   Notes that right ear feels "full" and clogged up;  Is not taking any blood pressure medication today- refills were sent from our office last week but apparently they were not filled at the pharmacy; no concerns on combination of Lotrel and Spironolactone;  Has stopped Allopurinol at some point in the past few months- Admits that anxiety still high due to family issues but working through it and still considering EAP; may take Lorazepam once/ week;     Objective:  Vitals:   10/03/21 1449  BP: (!) 166/82  Pulse: 88  Temp: 98.4 F (36.9 C)  TempSrc: Oral  SpO2: 98%  Weight: 174 lb 3.2 oz (79 kg)  Height: 5' 5" (1.651 m)    General: Well developed, well nourished, in no acute distress  Skin : Warm and dry.  Head: Normocephalic and atraumatic  Eyes: Sclera and conjunctiva clear; pupils round and reactive to light; extraocular movements intact  Ears: External normal; right cerumen impaction noted; after lavage, canals clear; tympanic membranes normal  Oropharynx: Pink, supple. No suspicious lesions  Neck: Supple without thyromegaly, adenopathy  Lungs: Respirations unlabored; clear to auscultation bilaterally without wheeze, rales, rhonchi  CVS exam: normal rate and regular rhythm.  °Neurologic: Alert and oriented; speech intact; face symmetrical; moves all extremities well; CNII-XII intact without focal deficit  ° °Assessment:  °1. Primary hypertension   °2. Elevated uric acid in blood   °3. Situational anxiety   °4. Impacted cerumen of right ear   °  °Plan:  °Uncontrolled as patient is not on her medications; refills updated; °Check uric acid level today to determine need to re-start Allopurinol; °Refill on Lorazepam prn; °Ear lavage completed with no  complications; ° °This visit occurred during the SARS-CoV-2 public health emergency.  Safety protocols were in place, including screening questions prior to the visit, additional usage of staff PPE, and extensive cleaning of exam room while observing appropriate contact time as indicated for disinfecting solutions.  ° ° °No follow-ups on file.  °Orders Placed This Encounter  °Procedures  ° CBC with Differential/Platelet  ° Comp Met (CMET)  ° Uric acid  °  °Requested Prescriptions  ° °Signed Prescriptions Disp Refills  ° amLODipine-benazepril (LOTREL) 10-40 MG capsule 90 capsule 2  °  Sig: Take 1 capsule by mouth daily.  ° spironolactone (ALDACTONE) 25 MG tablet 90 tablet 3  °  Sig: Take 1 tablet (25 mg total) by mouth daily.  ° LORazepam (ATIVAN) 0.5 MG tablet 30 tablet 1  °  Sig: Take 1 tablet (0.5 mg total) by mouth 2 (two) times daily as needed for anxiety.  °  ° °

## 2021-10-04 LAB — CBC WITH DIFFERENTIAL/PLATELET
Absolute Monocytes: 439 cells/uL (ref 200–950)
Basophils Absolute: 39 cells/uL (ref 0–200)
Basophils Relative: 0.5 %
Eosinophils Absolute: 208 cells/uL (ref 15–500)
Eosinophils Relative: 2.7 %
HCT: 38.6 % (ref 35.0–45.0)
Hemoglobin: 12.8 g/dL (ref 11.7–15.5)
Lymphs Abs: 1871 cells/uL (ref 850–3900)
MCH: 31.1 pg (ref 27.0–33.0)
MCHC: 33.2 g/dL (ref 32.0–36.0)
MCV: 93.7 fL (ref 80.0–100.0)
MPV: 12.5 fL (ref 7.5–12.5)
Monocytes Relative: 5.7 %
Neutro Abs: 5144 cells/uL (ref 1500–7800)
Neutrophils Relative %: 66.8 %
Platelets: 262 10*3/uL (ref 140–400)
RBC: 4.12 10*6/uL (ref 3.80–5.10)
RDW: 12.9 % (ref 11.0–15.0)
Total Lymphocyte: 24.3 %
WBC: 7.7 10*3/uL (ref 3.8–10.8)

## 2021-10-04 LAB — COMPREHENSIVE METABOLIC PANEL
AG Ratio: 1.5 (calc) (ref 1.0–2.5)
ALT: 11 U/L (ref 6–29)
AST: 16 U/L (ref 10–35)
Albumin: 4.3 g/dL (ref 3.6–5.1)
Alkaline phosphatase (APISO): 69 U/L (ref 37–153)
BUN: 16 mg/dL (ref 7–25)
CO2: 25 mmol/L (ref 20–32)
Calcium: 9.3 mg/dL (ref 8.6–10.4)
Chloride: 107 mmol/L (ref 98–110)
Creat: 0.83 mg/dL (ref 0.50–1.05)
Globulin: 2.8 g/dL (calc) (ref 1.9–3.7)
Glucose, Bld: 103 mg/dL — ABNORMAL HIGH (ref 65–99)
Potassium: 3.7 mmol/L (ref 3.5–5.3)
Sodium: 142 mmol/L (ref 135–146)
Total Bilirubin: 0.3 mg/dL (ref 0.2–1.2)
Total Protein: 7.1 g/dL (ref 6.1–8.1)

## 2021-10-04 LAB — URIC ACID: Uric Acid, Serum: 5.8 mg/dL (ref 2.5–7.0)

## 2021-10-08 ENCOUNTER — Other Ambulatory Visit: Payer: Self-pay | Admitting: Family

## 2022-04-09 ENCOUNTER — Telehealth: Payer: 59 | Admitting: Nurse Practitioner

## 2022-04-09 DIAGNOSIS — S60869A Insect bite (nonvenomous) of unspecified wrist, initial encounter: Secondary | ICD-10-CM

## 2022-04-09 DIAGNOSIS — W57XXXA Bitten or stung by nonvenomous insect and other nonvenomous arthropods, initial encounter: Secondary | ICD-10-CM

## 2022-04-09 MED ORDER — SULFAMETHOXAZOLE-TRIMETHOPRIM 800-160 MG PO TABS
1.0000 | ORAL_TABLET | Freq: Two times a day (BID) | ORAL | 0 refills | Status: AC
Start: 2022-04-09 — End: 2022-04-16

## 2022-04-09 MED ORDER — PREDNISONE 20 MG PO TABS
20.0000 mg | ORAL_TABLET | Freq: Two times a day (BID) | ORAL | 0 refills | Status: AC
Start: 2022-04-09 — End: 2022-04-14

## 2022-04-09 NOTE — Progress Notes (Signed)
E-Visit for Insect Sting  Thank you for describing the insect sting for [place].  Here is how we plan to help!  A sting that we will treat with a short course of prednisone.  The 2 greatest risks from insect stings are allergic reaction, which can be fatal in some people and infection, which is more common and less serious.  Bees, wasps, yellow jackets, and hornets belong to a class of insects called Hymenoptera.  Most insect stings cause only minor discomfort.  Stings can happen anywhere on the body and can be painful.  Most stings are from honey bees or yellow jackets.  Fire ants can sting multiple times.  The sites of the stings are more likely to become infected.    I have sent in prednisone 20 mg twice daily by mouth daily for 5 days to the pharmacy you selected.  Please make sure that you selected a pharmacy that is open now.  Since there is a potential for infection as well with the discoloration at the site of the sting we will also send in an antibiotic to use as well.   Meds ordered this encounter  Medications   predniSONE (DELTASONE) 20 MG tablet    Sig: Take 1 tablet (20 mg total) by mouth 2 (two) times daily with a meal for 5 days.    Dispense:  10 tablet    Refill:  0   sulfamethoxazole-trimethoprim (BACTRIM DS) 800-160 MG tablet    Sig: Take 1 tablet by mouth 2 (two) times daily for 7 days.    Dispense:  14 tablet    Refill:  0     What can be used to prevent Insect Stings?  Insect repellant with at least 20% DEET.  Wearing long pants and shirts with socks and shoes.  Wear dark or drab-colored clothes rather than bright colors.  Avoid using perfumes and hair sprays; these attract insects.  HOME CARE ADVICE:  1. Stinger removal: The stinger looks like a tiny black dot in the sting. Use a fingernail, credit card edge, or knife-edge to scrape it off.  Don't pull it out because it squeezes out more venom. If the stinger is below the skin surface, leave it alone.  It  will be shed with normal skin healing. 2. Use cold compresses to the area of the sting for 10-20 minutes.  You may repeat this as needed to relieve symptoms of pain and swelling. 3.  For pain relief, take acetominophen 650 mg 4-6 hours as needed or ibuprofen 400 mg every 6-8 hours as needed or naproxen 250-500 mg every 12 hours as needed. 4.  You can also use hydrocortisone cream 0.5% or 1% up to 4 times daily as needed for itching. 5.  If the sting becomes very itchy, take Benadryl 25-50 mg, follow directions on box. 6.  Wash the area 2-3 times daily with antibacterial soap and warm water. 7. Call your Doctor if: Fever, a severe headache, or rash occur in the next 2 weeks. Sting area begins to look infected. Redness and swelling worsens after home treatment. Your current symptoms become worse.    MAKE SURE YOU:  Understand these instructions. Will watch your condition. Will get help right away if you are not doing well or get worse.  Thank you for choosing an e-visit.  Your e-visit answers were reviewed by a board certified advanced clinical practitioner to complete your personal care plan. Depending upon the condition, your plan could have included both over  the counter or prescription medications.  Please review your pharmacy choice. Make sure the pharmacy is open so you can pick up prescription now. If there is a problem, you may contact your provider through [employer] and have the prescription routed to another pharmacy.  Your safety is important to [place]. If you have drug allergies check your prescription carefully.   For the next 24 hours you can use MyChart to ask questions about today's visit, request a non-urgent call back, or ask for a work or school excuse. You will get an email in the next two days asking about your experience. I hope that your e-visit has been valuable and will speed your recovery.   I spent approximately 5 minutes reviewing the patient's history,  current symptoms and coordinating their plan of care today.

## 2022-05-12 ENCOUNTER — Telehealth: Payer: 59 | Admitting: Family Medicine

## 2022-05-12 DIAGNOSIS — J069 Acute upper respiratory infection, unspecified: Secondary | ICD-10-CM

## 2022-05-12 MED ORDER — FLUTICASONE PROPIONATE 50 MCG/ACT NA SUSP: 2.0000 | Freq: Every day | NASAL | 0 refills | Status: AC

## 2022-05-12 MED ORDER — BENZONATATE 100 MG PO CAPS: 100.0000 mg | ORAL_CAPSULE | Freq: Two times a day (BID) | ORAL | 0 refills | Status: DC | PRN

## 2022-05-12 NOTE — Progress Notes (Signed)

## 2022-05-18 ENCOUNTER — Other Ambulatory Visit: Payer: Self-pay | Admitting: Family

## 2022-05-18 DIAGNOSIS — Z1231 Encounter for screening mammogram for malignant neoplasm of breast: Secondary | ICD-10-CM

## 2022-05-20 ENCOUNTER — Ambulatory Visit
Admission: RE | Admit: 2022-05-20 | Discharge: 2022-05-20 | Disposition: A | Discharge status: Home / Self Care | Payer: 59 | Source: Ambulatory Visit | Attending: Family | Admitting: Family

## 2022-05-20 DIAGNOSIS — Z1231 Encounter for screening mammogram for malignant neoplasm of breast: Secondary | ICD-10-CM

## 2022-07-23 ENCOUNTER — Other Ambulatory Visit: Payer: Self-pay | Admitting: Family

## 2022-07-23 ENCOUNTER — Encounter: Payer: Self-pay | Admitting: Family

## 2022-07-23 MED ORDER — LORAZEPAM 0.5 MG PO TABS: 0.5000 mg | ORAL_TABLET | Freq: Two times a day (BID) | ORAL | 0 refills | Status: DC | PRN

## 2022-07-23 MED ORDER — AMLODIPINE BESY-BENAZEPRIL HCL 10-40 MG PO CAPS: 1.0000 | ORAL_CAPSULE | Freq: Every day | ORAL | 0 refills | Status: DC

## 2022-07-23 NOTE — Telephone Encounter (Signed)
I have called the pt and scheduled a CPE in Nov for pt.

## 2022-07-23 NOTE — Telephone Encounter (Signed)
Requesting: lorazepam 0.5mg   Contract: None UDS: None Last Visit: 10/03/21 Next Visit: None Last Refill: 10/03/21 #30 and 1RF  Please Advise

## 2022-07-24 ENCOUNTER — Encounter: Payer: Self-pay | Admitting: Family

## 2022-07-24 NOTE — Telephone Encounter (Signed)
Okay to send in the 90 day supply?

## 2022-07-27 MED ORDER — AMLODIPINE BESY-BENAZEPRIL HCL 10-40 MG PO CAPS: 1.0000 | ORAL_CAPSULE | Freq: Every day | ORAL | 1 refills | Status: DC

## 2022-07-27 NOTE — Addendum Note (Signed)
Addended by: Kittie Plater, Toni Hoffmeister HUA on: 07/27/2022 11:18 AM   Modules accepted: Orders

## 2022-08-21 ENCOUNTER — Ambulatory Visit (INDEPENDENT_AMBULATORY_CARE_PROVIDER_SITE_OTHER): Payer: 59 | Admitting: Family

## 2022-08-21 VITALS — BP 132/80 | HR 69 | Temp 97.8°F | Resp 18 | Ht 65.0 in | Wt 175.4 lb

## 2022-08-21 DIAGNOSIS — Z0000 Encounter for general adult medical examination without abnormal findings: Secondary | ICD-10-CM

## 2022-08-21 DIAGNOSIS — Z13220 Encounter for screening for lipoid disorders: Secondary | ICD-10-CM

## 2022-08-21 DIAGNOSIS — M79672 Pain in left foot: Secondary | ICD-10-CM | POA: Diagnosis not present

## 2022-08-21 DIAGNOSIS — Z8249 Family history of ischemic heart disease and other diseases of the circulatory system: Secondary | ICD-10-CM

## 2022-08-21 DIAGNOSIS — J069 Acute upper respiratory infection, unspecified: Secondary | ICD-10-CM | POA: Diagnosis not present

## 2022-08-21 DIAGNOSIS — R899 Unspecified abnormal finding in specimens from other organs, systems and tissues: Secondary | ICD-10-CM

## 2022-08-21 LAB — CBC WITH DIFFERENTIAL/PLATELET
Absolute Monocytes: 474 cells/uL (ref 200–950)
Basophils Absolute: 32 cells/uL (ref 0–200)
Basophils Relative: 0.4 %
Eosinophils Absolute: 126 cells/uL (ref 15–500)
Eosinophils Relative: 1.6 %
HCT: 38.1 % (ref 35.0–45.0)
Hemoglobin: 13 g/dL (ref 11.7–15.5)
Lymphs Abs: 2362 cells/uL (ref 850–3900)
MCH: 31.8 pg (ref 27.0–33.0)
MCHC: 34.1 g/dL (ref 32.0–36.0)
MCV: 93.2 fL (ref 80.0–100.0)
MPV: 12.8 fL — ABNORMAL HIGH (ref 7.5–12.5)
Monocytes Relative: 6 %
Neutro Abs: 4906 cells/uL (ref 1500–7800)
Neutrophils Relative %: 62.1 %
Platelets: 248 10*3/uL (ref 140–400)
RBC: 4.09 10*6/uL (ref 3.80–5.10)
RDW: 12.5 % (ref 11.0–15.0)
Total Lymphocyte: 29.9 %
WBC: 7.9 10*3/uL (ref 3.8–10.8)

## 2022-08-21 LAB — COMPREHENSIVE METABOLIC PANEL
AG Ratio: 1.5 (calc) (ref 1.0–2.5)
ALT: 11 U/L (ref 6–29)
AST: 15 U/L (ref 10–35)
Albumin: 4.5 g/dL (ref 3.6–5.1)
Alkaline phosphatase (APISO): 64 U/L (ref 37–153)
BUN: 14 mg/dL (ref 7–25)
CO2: 26 mmol/L (ref 20–32)
Calcium: 10 mg/dL (ref 8.6–10.4)
Chloride: 104 mmol/L (ref 98–110)
Creat: 0.88 mg/dL (ref 0.50–1.05)
Globulin: 3.1 g/dL (calc) (ref 1.9–3.7)
Glucose, Bld: 94 mg/dL (ref 65–99)
Potassium: 4.1 mmol/L (ref 3.5–5.3)
Sodium: 140 mmol/L (ref 135–146)
Total Bilirubin: 0.5 mg/dL (ref 0.2–1.2)
Total Protein: 7.6 g/dL (ref 6.1–8.1)

## 2022-08-21 LAB — LIPID PANEL
Cholesterol: 269 mg/dL — ABNORMAL HIGH (ref <200)
HDL: 53 mg/dL (ref >=50)
LDL Cholesterol (Calc): 188 mg/dL (calc) — ABNORMAL HIGH
Non-HDL Cholesterol (Calc): 216 mg/dL (calc) — ABNORMAL HIGH (ref <130)
Total CHOL/HDL Ratio: 5.1 (calc) — ABNORMAL HIGH (ref <5.0)
Triglycerides: 139 mg/dL (ref <150)

## 2022-08-21 LAB — URIC ACID: Uric Acid, Serum: 7.1 mg/dL — ABNORMAL HIGH (ref 2.5–7.0)

## 2022-08-21 MED ORDER — SPIRONOLACTONE 25 MG PO TABS: 25.0000 mg | ORAL_TABLET | Freq: Every day | ORAL | 3 refills | Status: DC

## 2022-08-21 MED ORDER — MELOXICAM 15 MG PO TABS
15.0000 mg | ORAL_TABLET | Freq: Every day | ORAL | 1 refills | Status: DC
Start: 2022-08-21 — End: 2022-11-04

## 2022-08-21 NOTE — Progress Notes (Signed)
Patty Adams is a 61 y.o. female with the following history as recorded in EpicCare:  Patient Active Problem List   Diagnosis Date Noted   Somatic dysfunction of right sacroiliac joint 11/07/2018   Nonallopathic lesion of sacral region 11/07/2018   Nonallopathic lesion of lumbosacral region 11/07/2018   Nonallopathic lesion of thoracic region 11/07/2018   Bilateral hip pain 05/05/2017   Lateral epicondylitis of right elbow 08/17/2016   Carpal tunnel syndrome of right wrist 02/27/2016   Anxiety state 03/27/2015   Right hip pain 01/10/2015   Essential hypertension 01/10/2015    Current Outpatient Medications  Medication Sig Dispense Refill   amLODipine-benazepril (LOTREL) 10-40 MG capsule Take 1 capsule by mouth daily. 90 capsule 1   fluticasone (FLONASE) 50 MCG/ACT nasal spray Place 2 sprays into both nostrils daily. 16 g 0   LORazepam (ATIVAN) 0.5 MG tablet Take 1 tablet (0.5 mg total) by mouth 2 (two) times daily as needed for anxiety. 15 tablet 0   meloxicam (MOBIC) 15 MG tablet Take 1 tablet (15 mg total) by mouth daily. 30 tablet 1   spironolactone (ALDACTONE) 25 MG tablet Take 1 tablet (25 mg total) by mouth daily. 90 tablet 3   No current facility-administered medications for this visit.    Allergies: Patient has no known allergies.  Past Medical History:  Diagnosis Date   Chicken pox    Hyperlipidemia    Hypertension    UTI (lower urinary tract infection)     Past Surgical History:  Procedure Laterality Date   TUBAL LIGATION      Family History  Problem Relation Age of Onset   Hyperlipidemia Mother    Hypertension Mother    Dementia Mother    Hyperlipidemia Father    Hypertension Father    Diabetes Father    Diabetes Maternal Grandmother    Hypertension Maternal Grandfather    Hypertension Paternal Grandmother    Diabetes Paternal Grandmother    Breast cancer Neg Hx     Social History   Tobacco Use   Smoking status: Never   Smokeless tobacco:  Never  Substance Use Topics   Alcohol use: Yes    Comment: occasionally    Subjective:   Presents for yearly CPE; does have white coat hypertension/ not checking her pressure at home but has not been feeling any symptoms that are concerning for not being controlled;  Having left foot pain- "lump" located on bottom of foot;   Review of Systems  Constitutional: Negative.   HENT: Negative.    Eyes: Negative.   Respiratory: Negative.    Cardiovascular: Negative.   Gastrointestinal: Negative.   Genitourinary: Negative.   Musculoskeletal: Negative.   Skin: Negative.   Neurological: Negative.   Endo/Heme/Allergies: Negative.   Psychiatric/Behavioral: Negative.       Objective:  Vitals:   08/21/22 1450 08/21/22 1651  BP: (!) 156/80 132/80  Pulse: 69   Resp: 18   Temp: 97.8 F (36.6 C)   TempSrc: Temporal   SpO2: 97%   Weight: 175 lb 6.4 oz (79.6 kg)   Height: _0  (1.651 m)     General: Well developed, well nourished, in no acute distress  Skin : Warm and dry.  Head: Normocephalic and atraumatic  Eyes: Sclera and conjunctiva clear; pupils round and reactive to light; extraocular movements intact  Ears: External normal; canals clear; tympanic membranes normal  Oropharynx: Pink, supple. No suspicious lesions  Neck: Supple without thyromegaly, adenopathy  Lungs: Respirations unlabored; clear to  auscultation bilaterally without wheeze, rales, rhonchi  CVS exam: normal rate and regular rhythm.  Abdomen: Soft; nontender; nondistended; normoactive bowel sounds; no masses or hepatosplenomegaly  Musculoskeletal: No deformities; no active joint inflammation  Extremities: No edema, cyanosis, clubbing  Vessels: Symmetric bilaterally  Neurologic: Alert and oriented; speech intact; face symmetrical; moves all extremities well; CNII-XII intact without focal deficit  Assessment:  1. PE (physical exam), annual   2. Left foot pain   3. Viral URI with cough   4. Lipid screening   5.  Abnormal laboratory test result   6. FH: CAD (coronary artery disease)     Plan:  Age appropriate preventive healthcare needs addressed; encouraged regular eye doctor and dental exams; encouraged regular exercise; will update labs and refills as needed today; follow-up to be determined; Patient wants to come back and do pap smear/ Shingrix at later date;  Encouraged to try and start checking blood pressure occasionally;  Refer to cardiology per patient request;   No follow-ups on file.  Orders Placed This Encounter  Procedures   CBC with Differential/Platelet   Comp Met (CMET)   Lipid panel   Uric acid   Ambulatory referral to Podiatry    Referral Priority:   Routine    Referral Type:   Consultation    Referral Reason:   Specialty Services Required    Requested Specialty:   Podiatry    Number of Visits Requested:   1   Ambulatory referral to Cardiology    Referral Priority:   Routine    Referral Type:   Consultation    Referral Reason:   Specialty Services Required    Requested Specialty:   Cardiology    Number of Visits Requested:   1    Requested Prescriptions   Signed Prescriptions Disp Refills   meloxicam (MOBIC) 15 MG tablet 30 tablet 1    Sig: Take 1 tablet (15 mg total) by mouth daily.   spironolactone (ALDACTONE) 25 MG tablet 90 tablet 3    Sig: Take 1 tablet (25 mg total) by mouth daily.

## 2022-08-24 ENCOUNTER — Other Ambulatory Visit: Payer: Self-pay | Admitting: Family

## 2022-08-24 ENCOUNTER — Telehealth: Payer: Self-pay | Admitting: Family

## 2022-08-24 MED ORDER — ROSUVASTATIN CALCIUM 10 MG PO TABS: 10.0000 mg | ORAL_TABLET | Freq: Every day | ORAL | 0 refills | Status: DC

## 2022-08-24 NOTE — Telephone Encounter (Signed)
Pt called back to go over labs.  

## 2022-08-24 NOTE — Telephone Encounter (Signed)
I have called the pt and left a message from the provider. She stated that she is okay with the medication and if there is anything she needs to know about it. I have informed her I will let the provider know.   She would like the rx send it to [id number] - [place], [place] - [address]  Message sent to provider via Lab notes.

## 2022-09-15 ENCOUNTER — Ambulatory Visit: Payer: 59 | Admitting: Interventional Cardiology

## 2022-09-25 ENCOUNTER — Encounter: Payer: Self-pay | Admitting: Internal Medicine

## 2022-09-25 ENCOUNTER — Ambulatory Visit: Payer: 59 | Attending: Interventional Cardiology | Admitting: Internal Medicine

## 2022-09-25 VITALS — BP 130/76 | HR 68 | Ht 65.0 in | Wt 177.2 lb

## 2022-09-25 DIAGNOSIS — E785 Hyperlipidemia, unspecified: Secondary | ICD-10-CM | POA: Diagnosis not present

## 2022-09-25 DIAGNOSIS — I10 Essential (primary) hypertension: Secondary | ICD-10-CM

## 2022-09-25 DIAGNOSIS — Z8249 Family history of ischemic heart disease and other diseases of the circulatory system: Secondary | ICD-10-CM

## 2022-09-25 DIAGNOSIS — R9431 Abnormal electrocardiogram [ECG] [EKG]: Secondary | ICD-10-CM | POA: Diagnosis not present

## 2022-09-25 NOTE — Progress Notes (Signed)
OFFICE CONSULT NOTE  Chief Complaint:  Cardiovascular risk assessment  Primary Care Physician: Olive Bass, FNP  HPI:  Patty Adams is a 61 y.o. female who is being seen today for the evaluation of cardiovascular risk at the request of [place] Scrape Allyne Gee,*.  This is a pleasant 61 year old female kindly referred for evaluation and management of cardiovascular risk.  She unfortunately has recently been aware that her sister has congestive heart failure and has a pacemaker/ICD placed.  The etiology of this is not clear however it was concerning for her to reach out to assess her cardiovascular risk.  She notes that both of her parents have hypertension.  She was hypertensive today 160/85 however recheck blood pressure came down to 130/76.  She is on blood pressure medications including Lotrel and spironolactone.  Recently her lipids were assessed and are quite elevated.  Her total cholesterol was 269, HDL 53, triglycerides 139 and LDL 188.  She says that she was told her cholesterol was hide many years ago and was briefly on some treatment for that but for some reason did not continue with it and has not been told that her cholesterol was concerning until recently.  Her PCP had just started her on 10 mg rosuvastatin.  She thinks she might be having some side effects including worsening reflux and has been having leg cramps although this may have existed before the statin.  EKG was performed today that showed normal sinus rhythm but some nonspecific T wave changes.  She denies any chest pain or shortness of breath.  PMHx:  Past Medical History:  Diagnosis Date   Chicken pox    Hyperlipidemia    Hypertension    UTI (lower urinary tract infection)     Past Surgical History:  Procedure Laterality Date   TUBAL LIGATION      FAMHx:  Family History  Problem Relation Age of Onset   Hyperlipidemia Mother    Hypertension Mother    Dementia Mother    Hyperlipidemia Father     Hypertension Father    Diabetes Father    Diabetes Maternal Grandmother    Hypertension Maternal Grandfather    Hypertension Paternal Grandmother    Diabetes Paternal Grandmother    Breast cancer Neg Hx     SOCHx:   reports that she has never smoked. She has never used smokeless tobacco. She reports current alcohol use. She reports that she does not use drugs.  ALLERGIES:  No Known Allergies  NFA:[id number] items noted in HPI and remainder of comprehensive ROS otherwise negative. Pertinent items noted in HPI and remainder of comprehensive ROS otherwise negative.  HOME MEDS: Current Outpatient Medications on File Prior to Visit  Medication Sig Dispense Refill   amLODipine-benazepril (LOTREL) 10-40 MG capsule Take 1 capsule by mouth daily. 90 capsule 1   LORazepam (ATIVAN) 0.5 MG tablet Take 1 tablet (0.5 mg total) by mouth 2 (two) times daily as needed for anxiety. 15 tablet 0   rosuvastatin (CRESTOR) 10 MG tablet Take 1 tablet (10 mg total) by mouth daily. 90 tablet 0   spironolactone (ALDACTONE) 25 MG tablet Take 1 tablet (25 mg total) by mouth daily. 90 tablet 3   fluticasone (FLONASE) 50 MCG/ACT nasal spray Place 2 sprays into both nostrils daily. (Patient not taking: Reported on 09/25/2022) 16 g 0   meloxicam (MOBIC) 15 MG tablet Take 1 tablet (15 mg total) by mouth daily. (Patient not taking: Reported on 09/25/2022) 30 tablet 1  No current facility-administered medications on file prior to visit.    LABS/IMAGING: No results found for this or any previous visit (from the past 48 hour(s)). No results found.  LIPID PANEL:    Component Value Date/Time   CHOL 269 (H) 08/21/2022 1533   TRIG 139 08/21/2022 1533   HDL 53 08/21/2022 1533   CHOLHDL 5.1 (H) 08/21/2022 1533   VLDL 30.2 01/10/2021 1358   LDLCALC 188 (H) 08/21/2022 1533    WEIGHTS: Wt Readings from Last 3 Encounters:  09/25/22 177 lb 3.2 oz (80.4 kg)  08/21/22 175 lb 6.4 oz (79.6 kg)  10/03/21 174 lb 3.2  oz (79 kg)    VITALS: BP 130/76   Pulse 68   Ht 5\' 5"  (1.651 m)   Wt 177 lb 3.2 oz (80.4 kg)   SpO2 100%   BMI 29.49 kg/m   EXAM: General appearance: alert and no distress Neck: no carotid bruit, no JVD, and thyroid not enlarged, symmetric, no tenderness/mass/nodules Lungs: clear to auscultation bilaterally Heart: regular rate and rhythm, S1, S2 normal, no murmur, click, rub or gallop Abdomen: soft, non-tender; bowel sounds normal; no masses,  no organomegaly Extremities: extremities normal, atraumatic, no cyanosis or edema Pulses: 2+ and symmetric Skin: Skin color, texture, turgor normal. No rashes or lesions Neurologic: Grossly normal Psych: Pleasant  EKG: Normal sinus rhythm at 68, nonspecific T wave changes- personally reviewed  ASSESSMENT: Hypertension Dyslipidemia Family history of heart failure and hypertension Abnormal EKG  PLAN: 1.   Ms. Jakubek has risk factors for cardiovascular disease as well as some family history of heart failure and hypertension.  EKG shows some nonspecific T wave changes.  Blood pressure was elevated today however improved with a recheck.  Her cholesterol is quite high.  She says she does not believe it was always that high.  Part of this may be because she is postmenopausal or if she has a familial hyperlipidemia that tends to go up over time.  10 mg of rosuvastatin is not likely to get her to a target LDL of less than 100 or if she has some coronary disease less than 70.  I would like to get a calcium score to further with stratify her.  And fortunately it seems like she might be having side effects with the statin.  I advised a 2-week statin holiday.  If her symptoms improve we may consider lower dose or alternative statin or resume it if there are no new changes.  She is agreeable to this plan.  Will follow-up with lipids and reassess her calcium score results in about 3 to 4 months.  Thanks again for the kind referral.  Jennette Kettle, MD,  [facility]  [place]  [facility]  Medical Director of the [facility]  [facility] Diplomate of the American Board of Clinical Lipidology Attending Cardiologist  Direct Dial: [phone]  Fax: [phone]  Website:  [email or web address] 09/25/2022, 12:51 PM

## 2022-09-25 NOTE — Patient Instructions (Signed)
Medication Instructions:   -You may take a statin holiday for 2 weeks. If symptoms resolve please let us know. If there is no change please resume medication as prescribed.   *If you need a refill on your cardiac medications before your next appointment, please call your pharmacy*   Lab Work: Your physician recommends that you return for lab work in: 3-4 months for FASTING lipid panel & LPA  If you have labs (blood work) drawn today and your tests are completely normal, you will receive your results only by: MyChart Message (if you have MyChart) OR A paper copy in the mail If you have any lab test that is abnormal or we need to change your treatment, we will call you to review the results.   Testing/Procedures: Dr. Rennis Golden has ordered a CT coronary calcium score.   Test locations:  [facility]   This is $99 out of pocket.   Coronary CalciumScan A coronary calcium scan is an imaging test used to look for deposits of calcium and other fatty materials (plaques) in the inner lining of the blood vessels of the heart (coronary arteries). These deposits of calcium and plaques can partly clog and narrow the coronary arteries without producing any symptoms or warning signs. This puts a person at risk for a heart attack. This test can detect these deposits before symptoms develop. Tell a health care provider about: Any allergies you have. All medicines you are taking, including vitamins, herbs, eye drops, creams, and over-the-counter medicines. Any problems you or family members have had with anesthetic medicines. Any blood disorders you have. Any surgeries you have had. Any medical conditions you have. Whether you are pregnant or may be pregnant. What are the risks? Generally, this is a safe procedure. However, problems may occur, including: Harm to a pregnant woman and her unborn baby. This test involves the use of radiation. Radiation exposure can be  dangerous to a pregnant woman and her unborn baby. If you are pregnant, you generally should not have this procedure done. Slight increase in the risk of cancer. This is because of the radiation involved in the test. What happens before the procedure? No preparation is needed for this procedure. What happens during the procedure? You will undress and remove any jewelry around your neck or chest. You will put on a hospital gown. Sticky electrodes will be placed on your chest. The electrodes will be connected to an electrocardiogram (ECG) machine to record a tracing of the electrical activity of your heart. A CT scanner will take pictures of your heart. During this time, you will be asked to lie still and hold your breath for 2-3 seconds while a picture of your heart is being taken. The procedure may vary among health care providers and hospitals. What happens after the procedure? You can get dressed. You can return to your normal activities. It is up to you to get the results of your test. Ask your health care provider, or the department that is doing the test, when your results will be ready. Summary A coronary calcium scan is an imaging test used to look for deposits of calcium and other fatty materials (plaques) in the inner lining of the blood vessels of the heart (coronary arteries). Generally, this is a safe procedure. Tell your health care provider if you are pregnant or may be pregnant. No preparation is needed for this procedure. A CT scanner will take pictures of your heart. You can return to your  normal activities after the scan is done. This information is not intended to replace advice given to you by your health care provider. Make sure you discuss any questions you have with your health care provider. Document Released: 03/19/2008 Document Revised: 08/10/2016 Document Reviewed: 08/10/2016 Elsevier Interactive Patient Education  2017 [employer].    Follow-Up: At [facility], you and your health needs are our priority.  As part of our continuing mission to provide you with exceptional heart care, we have created designated Provider Care Teams.  These Care Teams include your primary Cardiologist (physician) and Advanced Practice Providers (APPs -  Physician Assistants and Nurse Practitioners) who all work together to provide you with the care you need, when you need it.  We recommend signing up for the patient portal called "MyChart".  Sign up information is provided on this After Visit Summary.  MyChart is used to connect with patients for Virtual Visits (Telemedicine).  Patients are able to view lab/test results, encounter notes, upcoming appointments, etc.  Non-urgent messages can be sent to your provider as well.   To learn more about what you can do with MyChart, go to [email or web address].    Your next appointment:   3-4 month(s)  The format for your next appointment:   In Person  Provider:   K. [place] Hilty, MD

## 2022-10-07 ENCOUNTER — Ambulatory Visit (HOSPITAL_BASED_OUTPATIENT_CLINIC_OR_DEPARTMENT_OTHER)
Admission: RE | Admit: 2022-10-07 | Discharge: 2022-10-07 | Disposition: A | Discharge status: Home / Self Care | Payer: 59 | Source: Ambulatory Visit | Attending: Internal Medicine | Admitting: Internal Medicine

## 2022-10-07 DIAGNOSIS — Z8249 Family history of ischemic heart disease and other diseases of the circulatory system: Secondary | ICD-10-CM | POA: Insufficient documentation

## 2022-10-07 DIAGNOSIS — E785 Hyperlipidemia, unspecified: Secondary | ICD-10-CM | POA: Insufficient documentation

## 2022-10-12 MED ORDER — ATORVASTATIN CALCIUM 20 MG PO TABS: 20.0000 mg | ORAL_TABLET | Freq: Every day | ORAL | 3 refills | Status: DC

## 2022-10-22 ENCOUNTER — Ambulatory Visit: Payer: 59 | Admitting: Family

## 2022-11-03 ENCOUNTER — Ambulatory Visit: Payer: 59 | Admitting: Family

## 2022-11-04 ENCOUNTER — Other Ambulatory Visit: Payer: Self-pay | Admitting: Family

## 2022-11-17 ENCOUNTER — Other Ambulatory Visit: Payer: Self-pay | Admitting: Family

## 2022-11-25 ENCOUNTER — Telehealth: Payer: 59 | Admitting: Physician Assistant

## 2022-11-25 DIAGNOSIS — R42 Dizziness and giddiness: Secondary | ICD-10-CM

## 2022-11-25 DIAGNOSIS — R112 Nausea with vomiting, unspecified: Secondary | ICD-10-CM

## 2022-11-25 MED ORDER — MECLIZINE HCL 25 MG PO TABS: 12.5000 mg | ORAL_TABLET | Freq: Three times a day (TID) | ORAL | 0 refills | Status: DC | PRN

## 2022-11-25 MED ORDER — ONDANSETRON 4 MG PO TBDP: 4.0000 mg | ORAL_TABLET | Freq: Three times a day (TID) | ORAL | 0 refills | Status: DC | PRN

## 2022-11-25 NOTE — Progress Notes (Signed)
Thank you for the details you included in the comment boxes. Those details are very helpful in determining the best course of treatment for you and help [place] to provide the best care. Because of having dizziness associated with nausea and vomiting, we recommend that you convert this visit to a video visit in order for the provider to better assess what is going on.  The provider will be able to give you a more accurate diagnosis and treatment plan if we can more freely discuss your symptoms and with the addition of a virtual examination.   If you convert to a video visit, we will bill your insurance (similar to an office visit) and you will not be charged for this e-Visit. You will be able to stay at home and speak with the first available [facility] advanced practice provider. The link to do a video visit is in the drop down Menu tab of your Welcome screen in [place].  You will need to schedule the video visit. You can do this through one of two ways:  1) Go into your MyChart App and select the "Menu" button, then select the "Virtual Urgent Care Visit" then proceed scheduling -OR- 2) Go to [email or web address] and select "Get Started" under the Virtual Urgent Care option, select "View all options", then select the "Schedule on your Time" and proceed with scheduling.  Best Regards,  Grace Bushy, PA-C  I have spent 5 minutes in review of e-visit questionnaire, review and updating patient chart, medical decision making and response to patient.   Mar Daring, PA-C

## 2022-11-25 NOTE — Progress Notes (Signed)
Virtual Visit Consent   Patty Adams, you are scheduled for a virtual visit with a [place] provider today. Just as with appointments in the office, your consent must be obtained to participate. Your consent will be active for this visit and any virtual visit you may have with one of our providers in the next 365 days. If you have a MyChart account, a copy of this consent can be sent to you electronically.  As this is a virtual visit, video technology does not allow for your provider to perform a traditional examination. This may limit your provider's ability to fully assess your condition. If your provider identifies any concerns that need to be evaluated in person or the need to arrange testing (such as labs, EKG, etc.), we will make arrangements to do so. Although advances in technology are sophisticated, we cannot ensure that it will always work on either your end or our end. If the connection with a video visit is poor, the visit may have to be switched to a telephone visit. With either a video or telephone visit, we are not always able to ensure that we have a secure connection.  By engaging in this virtual visit, you consent to the provision of healthcare and authorize for your insurance to be billed (if applicable) for the services provided during this visit. Depending on your insurance coverage, you may receive a charge related to this service.  I need to obtain your verbal consent now. Are you willing to proceed with your visit today? Patty Adams has provided verbal consent on 11/25/2022 for a virtual visit (video or telephone). Mar Daring, PA-C  Date: 11/25/2022 10:40 AM  Virtual Visit via Video Note   I, Mar Daring, connected with  Patty Adams  ([id number], 07/22/1961) on 11/25/22 at 10:30 AM EST by a video-enabled telemedicine application and verified that I am speaking with the correct person using two identifiers.  Location: Patient: Virtual  Visit Location Patient: Home Provider: Virtual Visit Location Provider: Home Office   I discussed the limitations of evaluation and management by telemedicine and the availability of in person appointments. The patient expressed understanding and agreed to proceed.    History of Present Illness: Patty Adams is a 62 y.o. who identifies as a female who was assigned female at birth, and is being seen today for nausea, vomiting, dizziness.  HPI: Emesis  This is a new problem. The current episode started yesterday. The problem occurs 5 to 10 times per day. The problem has been unchanged. The emesis has an appearance of stomach contents. There has been no fever. Associated symptoms include dizziness. She has tried increased fluids for the symptoms. The treatment provided no relief.  Dizziness This is a new problem. The current episode started yesterday (has had 2-3 times prior). The problem occurs intermittently. The problem has been gradually improving. Associated symptoms include anorexia, congestion, fatigue, nausea, vertigo, vomiting and weakness. Associated symptoms comments: Diarrhea, describes room spinning, laying on right side worsens vertigo, felt pop in right ear prior to vertigo. She has tried lying down for the symptoms. The treatment provided no relief.     Problems:  Patient Active Problem List   Diagnosis Date Noted   Somatic dysfunction of right sacroiliac joint 11/07/2018   Nonallopathic lesion of sacral region 11/07/2018   Nonallopathic lesion of lumbosacral region 11/07/2018   Nonallopathic lesion of thoracic region 11/07/2018   Bilateral hip pain 05/05/2017   Lateral epicondylitis of right  elbow 08/17/2016   Carpal tunnel syndrome of right wrist 02/27/2016   Anxiety state 03/27/2015   Right hip pain 01/10/2015   Essential hypertension 01/10/2015    Allergies: No Known Allergies Medications:  Current Outpatient Medications:    meclizine (ANTIVERT) 25 MG tablet,  Take 0.5-1 tablets (12.5-25 mg total) by mouth 3 (three) times daily as needed for dizziness., Disp: 30 tablet, Rfl: 0   ondansetron (ZOFRAN-ODT) 4 MG disintegrating tablet, Take 1 tablet (4 mg total) by mouth every 8 (eight) hours as needed., Disp: 20 tablet, Rfl: 0   amLODipine-benazepril (LOTREL) 10-40 MG capsule, Take 1 capsule by mouth daily., Disp: 90 capsule, Rfl: 1   atorvastatin (LIPITOR) 20 MG tablet, Take 1 tablet (20 mg total) by mouth at bedtime., Disp: 90 tablet, Rfl: 3   fluticasone (FLONASE) 50 MCG/ACT nasal spray, Place 2 sprays into both nostrils daily. (Patient not taking: Reported on 09/25/2022), Disp: 16 g, Rfl: 0   LORazepam (ATIVAN) 0.5 MG tablet, TAKE 1 TABLET BY MOUTH 2 TIMES DAILY AS NEEDED FOR ANXIETY., Disp: 15 tablet, Rfl: 0   meloxicam (MOBIC) 15 MG tablet, TAKE 1 TABLET (15 MG TOTAL) BY MOUTH DAILY., Disp: 30 tablet, Rfl: 0   spironolactone (ALDACTONE) 25 MG tablet, Take 1 tablet (25 mg total) by mouth daily., Disp: 90 tablet, Rfl: 3  Observations/Objective: Patient is well-developed, well-nourished in no acute distress.  Resting comfortably at home.  Head is normocephalic, atraumatic.  No labored breathing.  Speech is clear and coherent with logical content.  Patient is alert and oriented at baseline.    Assessment and Plan: 1. Vertigo - meclizine (ANTIVERT) 25 MG tablet; Take 0.5-1 tablets (12.5-25 mg total) by mouth 3 (three) times daily as needed for dizziness.  Dispense: 30 tablet; Refill: 0  2. Nausea and vomiting, unspecified vomiting type - ondansetron (ZOFRAN-ODT) 4 MG disintegrating tablet; Take 1 tablet (4 mg total) by mouth every 8 (eight) hours as needed.  Dispense: 20 tablet; Refill: 0  - Vertigo triggering nausea and vomiting, no warning signs at this time requiring emergent evaluation - Meclizine prescribed - Zofran for nausea - Push fluids, electrolyte beverages - Rest - Seek in person evaluation urgently if symptoms worsen or fail to  improve with treatment  Follow Up Instructions: I discussed the assessment and treatment plan with the patient. The patient was provided an opportunity to ask questions and all were answered. The patient agreed with the plan and demonstrated an understanding of the instructions.  A copy of instructions were sent to the patient via MyChart unless otherwise noted below.    The patient was advised to call back or seek an in-person evaluation if the symptoms worsen or if the condition fails to improve as anticipated.  Time:  I spent 12 minutes with the patient via telehealth technology discussing the above problems/concerns.    Mar Daring, PA-C

## 2022-11-25 NOTE — Patient Instructions (Signed)
Patty Adams, thank you for joining Mar Daring, PA-C for today's virtual visit.  While this provider is not your primary care provider (PCP), if your PCP is located in our provider database this encounter information will be shared with them immediately following your visit.   [facility] account gives you access to today's visit and all your visits, tests, and labs performed at [facility] " click here if you don't have a Patty Adams account or go to [email or web address]  Consent: (Patient) Patty Adams provided verbal consent for this virtual visit at the beginning of the encounter.  Current Medications:  Current Outpatient Medications:    meclizine (ANTIVERT) 25 MG tablet, Take 0.5-1 tablets (12.5-25 mg total) by mouth 3 (three) times daily as needed for dizziness., Disp: 30 tablet, Rfl: 0   ondansetron (ZOFRAN-ODT) 4 MG disintegrating tablet, Take 1 tablet (4 mg total) by mouth every 8 (eight) hours as needed., Disp: 20 tablet, Rfl: 0   amLODipine-benazepril (LOTREL) 10-40 MG capsule, Take 1 capsule by mouth daily., Disp: 90 capsule, Rfl: 1   atorvastatin (LIPITOR) 20 MG tablet, Take 1 tablet (20 mg total) by mouth at bedtime., Disp: 90 tablet, Rfl: 3   fluticasone (FLONASE) 50 MCG/ACT nasal spray, Place 2 sprays into both nostrils daily. (Patient not taking: Reported on 09/25/2022), Disp: 16 g, Rfl: 0   LORazepam (ATIVAN) 0.5 MG tablet, TAKE 1 TABLET BY MOUTH 2 TIMES DAILY AS NEEDED FOR ANXIETY., Disp: 15 tablet, Rfl: 0   meloxicam (MOBIC) 15 MG tablet, TAKE 1 TABLET (15 MG TOTAL) BY MOUTH DAILY., Disp: 30 tablet, Rfl: 0   spironolactone (ALDACTONE) 25 MG tablet, Take 1 tablet (25 mg total) by mouth daily., Disp: 90 tablet, Rfl: 3   Medications ordered in this encounter:  Meds ordered this encounter  Medications   meclizine (ANTIVERT) 25 MG tablet    Sig: Take 0.5-1 tablets (12.5-25 mg total) by mouth 3 (three) times daily as  needed for dizziness.    Dispense:  30 tablet    Refill:  0    Order Specific Question:   Supervising Provider    Answer:   LAMPTEY, PHILIP O [id number]   ondansetron (ZOFRAN-ODT) 4 MG disintegrating tablet    Sig: Take 1 tablet (4 mg total) by mouth every 8 (eight) hours as needed.    Dispense:  20 tablet    Refill:  0    Order Specific Question:   Supervising Provider    Answer:   Chase Picket [id number]     *If you need refills on other medications prior to your next appointment, please contact your pharmacy*  Follow-Up: Call back or seek an in-person evaluation if the symptoms worsen or if the condition fails to improve as anticipated.  [place] [phone]  Other Instructions  Vertigo Vertigo is the feeling that you or your surroundings are moving when they are not. This feeling can come and go at any time. Vertigo often goes away on its own. Vertigo can be dangerous if it occurs while you are doing something that could endanger yourself or others, such as driving or operating machinery. Your health care provider will do tests to try to determine the cause of your vertigo. Tests will also help your health care provider decide how best to treat your condition. Follow these instructions at home: Eating and drinking     Dehydration can make vertigo worse. Drink enough fluid to keep your urine pale  yellow. Do not drink alcohol. Activity Return to your normal activities as told by your health care provider. Ask your health care provider what activities are safe for you. In the morning, first sit up on the side of the bed. When you feel okay, stand slowly while you hold onto something until you know that your balance is fine. Move slowly. Avoid sudden body or head movements or certain positions, as told by your health care provider. If you have trouble walking or keeping your balance, try using a cane for stability. If you feel dizzy or unstable, sit down  right away. Avoid doing any tasks that would cause danger to you or others if vertigo occurs. Avoid bending down if you feel dizzy. Place items in your home so that they are easy for you to reach without bending or leaning over. Do not drive or use machinery if you feel dizzy. General instructions Take over-the-counter and prescription medicines only as told by your health care provider. Keep all follow-up visits. This is important. Contact a health care provider if: Your medicines do not relieve your vertigo or they make it worse. Your condition gets worse or you develop new symptoms. You have a fever. You develop nausea or vomiting, or if nausea gets worse. Your family or friends notice any behavioral changes. You have numbness or a prickling and tingling sensation in part of your body. Get help right away if you: Are always dizzy or you faint. Develop severe headaches. Develop a stiff neck. Develop sensitivity to light. Have difficulty moving or speaking. Have weakness in your hands, arms, or legs. Have changes in your hearing or vision. These symptoms may represent a serious problem that is an emergency. Do not wait to see if the symptoms will go away. Get medical help right away. Call your local emergency services (911 in the [place] Do not drive yourself to the hospital. Summary Vertigo is the feeling that you or your surroundings are moving when they are not. Your health care provider will do tests to try to determine the cause of your vertigo. Follow instructions for home care. You may be told to avoid certain tasks, positions, or movements. Contact a health care provider if your medicines do not relieve your symptoms, or if you have a fever, nausea, vomiting, or changes in behavior. Get help right away if you have severe headaches or difficulty speaking, or you develop hearing or vision problems. This information is not intended to replace advice given to you by your health care  provider. Make sure you discuss any questions you have with your health care provider. Document Revised: 08/21/2020 Document Reviewed: 08/21/2020 Elsevier Patient Education  [employer].    If you have been instructed to have an in-person evaluation today at a local Urgent Care facility, please use the link below. It will take you to a list of all of our available [facility], including address, phone number and hours of operation. Please do not delay care.  [facility]  If you or a family member do not have a primary care provider, use the link below to schedule a visit and establish care. When you choose a [facility] primary care physician or advanced practice provider, you gain a long-term partner in health. Find a Primary Care Provider  Learn more about [facility]'s in-office and virtual care options: [facility]

## 2022-12-13 ENCOUNTER — Other Ambulatory Visit: Payer: Self-pay | Admitting: Family

## 2023-01-18 ENCOUNTER — Ambulatory Visit: Payer: 59 | Admitting: Internal Medicine

## 2023-02-11 ENCOUNTER — Other Ambulatory Visit: Payer: Self-pay | Admitting: Family

## 2023-05-22 ENCOUNTER — Other Ambulatory Visit: Payer: Self-pay | Admitting: Family

## 2023-05-24 ENCOUNTER — Other Ambulatory Visit: Payer: Self-pay | Admitting: Family

## 2023-06-22 ENCOUNTER — Other Ambulatory Visit: Payer: Self-pay | Admitting: Family

## 2023-06-29 ENCOUNTER — Ambulatory Visit: Payer: 59 | Admitting: Family

## 2023-07-29 ENCOUNTER — Encounter: Payer: Self-pay | Admitting: Family

## 2023-07-29 NOTE — Telephone Encounter (Signed)
This is day 4 of being covid positive/ having sxs. Please advise. Sending to [facility] since she is DOD

## 2023-07-30 ENCOUNTER — Telehealth (INDEPENDENT_AMBULATORY_CARE_PROVIDER_SITE_OTHER): Payer: 59 | Admitting: Family

## 2023-07-30 ENCOUNTER — Encounter: Payer: Self-pay | Admitting: Family

## 2023-07-30 VITALS — BP 142/80 | Ht 65.0 in

## 2023-07-30 DIAGNOSIS — U071 COVID-19: Secondary | ICD-10-CM

## 2023-07-30 MED ORDER — BENZONATATE 200 MG PO CAPS: 200.0000 mg | ORAL_CAPSULE | Freq: Three times a day (TID) | ORAL | 0 refills | Status: DC | PRN

## 2023-07-30 NOTE — Progress Notes (Signed)
Patty Adams is a 62 y.o. female with the following history as recorded in EpicCare:  Patient Active Problem List   Diagnosis Date Noted   Somatic dysfunction of right sacroiliac joint 11/07/2018   Nonallopathic lesion of sacral region 11/07/2018   Nonallopathic lesion of lumbosacral region 11/07/2018   Nonallopathic lesion of thoracic region 11/07/2018   Bilateral hip pain 05/05/2017   Lateral epicondylitis of right elbow 08/17/2016   Carpal tunnel syndrome of right wrist 02/27/2016   Anxiety state 03/27/2015   Right hip pain 01/10/2015   Essential hypertension 01/10/2015    Current Outpatient Medications  Medication Sig Dispense Refill   amLODipine-benazepril (LOTREL) 10-40 MG capsule TAKE 1 CAPSULE BY MOUTH EVERY DAY. NEEDS APPT. INSURANCE WILL ONLY PAY FOR 90 DAYS 30 capsule 0   atorvastatin (LIPITOR) 20 MG tablet Take 1 tablet (20 mg total) by mouth at bedtime. 90 tablet 3   benzonatate (TESSALON) 200 MG capsule Take 1 capsule (200 mg total) by mouth 3 (three) times daily as needed for cough. 20 capsule 0   fluticasone (FLONASE) 50 MCG/ACT nasal spray Place 2 sprays into both nostrils daily. 16 g 0   LORazepam (ATIVAN) 0.5 MG tablet TAKE 1 TABLET BY MOUTH 2 TIMES DAILY AS NEEDED FOR ANXIETY. 15 tablet 0   meclizine (ANTIVERT) 25 MG tablet Take 0.5-1 tablets (12.5-25 mg total) by mouth 3 (three) times daily as needed for dizziness. 30 tablet 0   meloxicam (MOBIC) 15 MG tablet TAKE 1 TABLET (15 MG TOTAL) BY MOUTH DAILY. 15 tablet 0   ondansetron (ZOFRAN-ODT) 4 MG disintegrating tablet Take 1 tablet (4 mg total) by mouth every 8 (eight) hours as needed. 20 tablet 0   spironolactone (ALDACTONE) 25 MG tablet Take 1 tablet (25 mg total) by mouth daily. 90 tablet 3   No current facility-administered medications for this visit.    Allergies: Patient has no known allergies.  Past Medical History:  Diagnosis Date   Chicken pox    Hyperlipidemia    Hypertension    UTI (lower  urinary tract infection)     Past Surgical History:  Procedure Laterality Date   TUBAL LIGATION      Family History  Problem Relation Age of Onset   Hyperlipidemia Mother    Hypertension Mother    Dementia Mother    Hyperlipidemia Father    Hypertension Father    Diabetes Father    Diabetes Maternal Grandmother    Hypertension Maternal Grandfather    Hypertension Paternal Grandmother    Diabetes Paternal Grandmother    Breast cancer Neg Hx     Social History   Tobacco Use   Smoking status: Never   Smokeless tobacco: Never  Substance Use Topics   Alcohol use: Yes    Comment: occasionally    Subjective:    I connected with Patty Adams on 07/30/23 at  1:20 PM EDT by a video enabled telemedicine application and verified that I am speaking with the correct person using two identifiers.   I discussed the limitations of evaluation and management by telemedicine and the availability of in person appointments. The patient expressed understanding and agreed to proceed. Provider in office/ patient is at home; provider and patient are only 2 people on video call.   Started feeling sick on Sunday evening; tested positive for COVID yesterday; notes does feel she is improving; no fever, headache, chest pain or shortness of breath; husband started with symptoms yesterday and is COVID positive as well;  Objective:  Vitals:   07/30/23 1318  BP: (!) 142/80  Height: 5\' 5"  (1.651 m)    General: Well developed, well nourished, in no acute distress  Skin : Warm and dry.  Head: Normocephalic and atraumatic  Lungs: Respirations unlabored;  Neurologic: Alert and oriented; speech intact; face symmetrical;   Assessment:  1. COVID-19     Plan:  Symptoms have been present more than 5 days and patient does feel she is improving; not a candidate for Paxlovid at this time; symptomatic treatment discussed including need for fluids, rest; Rx for Tessalon perles 200 mg tid prn; follow  up in person if symptoms persist.   No follow-ups on file.  No orders of the defined types were placed in this encounter.   Requested Prescriptions   Signed Prescriptions Disp Refills   benzonatate (TESSALON) 200 MG capsule 20 capsule 0    Sig: Take 1 capsule (200 mg total) by mouth 3 (three) times daily as needed for cough.

## 2023-08-05 ENCOUNTER — Other Ambulatory Visit: Payer: Self-pay | Admitting: Family

## 2023-08-13 ENCOUNTER — Other Ambulatory Visit: Payer: Self-pay | Admitting: Family

## 2023-08-16 ENCOUNTER — Other Ambulatory Visit: Payer: Self-pay | Admitting: Family

## 2023-08-16 MED ORDER — AMLODIPINE BESY-BENAZEPRIL HCL 10-40 MG PO CAPS: 1.0000 | ORAL_CAPSULE | Freq: Every day | ORAL | 0 refills | Status: DC

## 2023-09-06 ENCOUNTER — Other Ambulatory Visit: Payer: Self-pay | Admitting: Family

## 2023-10-01 ENCOUNTER — Other Ambulatory Visit: Payer: Self-pay | Admitting: Family

## 2023-10-01 ENCOUNTER — Telehealth: Payer: Self-pay | Admitting: Family

## 2023-10-01 NOTE — Telephone Encounter (Signed)
Please mail her a letter- she is overdue for an OV to follow up on her blood pressure. Last refill was sent today;

## 2023-10-04 NOTE — Telephone Encounter (Signed)
Letter has been printed and placed up front to mail out to pt.

## 2023-11-04 ENCOUNTER — Other Ambulatory Visit: Payer: Self-pay | Admitting: Family

## 2023-11-16 ENCOUNTER — Ambulatory Visit (INDEPENDENT_AMBULATORY_CARE_PROVIDER_SITE_OTHER): Payer: 59 | Admitting: Family

## 2023-11-16 VITALS — BP 144/78 | HR 73 | Ht 65.0 in | Wt 179.6 lb

## 2023-11-16 DIAGNOSIS — Z0000 Encounter for general adult medical examination without abnormal findings: Secondary | ICD-10-CM

## 2023-11-16 DIAGNOSIS — Z1231 Encounter for screening mammogram for malignant neoplasm of breast: Secondary | ICD-10-CM

## 2023-11-16 DIAGNOSIS — E790 Hyperuricemia without signs of inflammatory arthritis and tophaceous disease: Secondary | ICD-10-CM | POA: Diagnosis not present

## 2023-11-16 DIAGNOSIS — R7309 Other abnormal glucose: Secondary | ICD-10-CM

## 2023-11-16 DIAGNOSIS — Z13220 Encounter for screening for lipoid disorders: Secondary | ICD-10-CM

## 2023-11-16 MED ORDER — AMLODIPINE BESY-BENAZEPRIL HCL 10-40 MG PO CAPS: 1.0000 | ORAL_CAPSULE | Freq: Every day | ORAL | 3 refills | Status: AC

## 2023-11-16 MED ORDER — LORAZEPAM 0.5 MG PO TABS
0.5000 mg | ORAL_TABLET | Freq: Two times a day (BID) | ORAL | 0 refills | Status: AC | PRN
Start: 2023-11-16 — End: ?

## 2023-11-16 MED ORDER — ATORVASTATIN CALCIUM 20 MG PO TABS: 20.0000 mg | ORAL_TABLET | Freq: Every day | ORAL | 3 refills | Status: AC

## 2023-11-16 MED ORDER — SPIRONOLACTONE 25 MG PO TABS: 25.0000 mg | ORAL_TABLET | Freq: Every day | ORAL | 3 refills | Status: AC

## 2023-11-16 NOTE — Progress Notes (Signed)
Patty Adams is a 63 y.o. female with the following history as recorded in EpicCare:  Patient Active Problem List   Diagnosis Date Noted   Somatic dysfunction of right sacroiliac joint 11/07/2018   Nonallopathic lesion of sacral region 11/07/2018   Nonallopathic lesion of lumbosacral region 11/07/2018   Nonallopathic lesion of thoracic region 11/07/2018   Bilateral hip pain 05/05/2017   Lateral epicondylitis of right elbow 08/17/2016   Carpal tunnel syndrome of right wrist 02/27/2016   Anxiety state 03/27/2015   Right hip pain 01/10/2015   Essential hypertension 01/10/2015    Current Outpatient Medications  Medication Sig Dispense Refill   fluticasone (FLONASE) 50 MCG/ACT nasal spray Place 2 sprays into both nostrils daily. 16 g 0   amLODipine-benazepril (LOTREL) 10-40 MG capsule Take 1 capsule by mouth daily. 90 capsule 3   atorvastatin (LIPITOR) 20 MG tablet Take 1 tablet (20 mg total) by mouth at bedtime. 90 tablet 3   LORazepam (ATIVAN) 0.5 MG tablet Take 1 tablet (0.5 mg total) by mouth 2 (two) times daily as needed for anxiety. 15 tablet 0   spironolactone (ALDACTONE) 25 MG tablet Take 1 tablet (25 mg total) by mouth daily. 90 tablet 3   No current facility-administered medications for this visit.    Allergies: Patient has no known allergies.  Past Medical History:  Diagnosis Date   Chicken pox    Hyperlipidemia    Hypertension    UTI (lower urinary tract infection)     Past Surgical History:  Procedure Laterality Date   TUBAL LIGATION      Family History  Problem Relation Age of Onset   Hyperlipidemia Mother    Hypertension Mother    Dementia Mother    Hyperlipidemia Father    Hypertension Father    Diabetes Father    Diabetes Maternal Grandmother    Hypertension Maternal Grandfather    Hypertension Paternal Grandmother    Diabetes Paternal Grandmother    Breast cancer Neg Hx     Social History   Tobacco Use   Smoking status: Never   Smokeless  tobacco: Never  Substance Use Topics   Alcohol use: Yes    Comment: occasionally    Subjective:   Presents for yearly CPE; has not been taking her blood pressure medication regularly; last seen in person in November 2023;  History of elevated uric acid- was due to get level re-checked in January 2024 but was not able to keep that appointment; notes she may take Allopurinol once or twice a month;   Review of Systems  Constitutional: Negative.   HENT: Negative.    Eyes: Negative.   Respiratory: Negative.    Cardiovascular: Negative.   Gastrointestinal: Negative.   Genitourinary: Negative.   Musculoskeletal: Negative.   Skin: Negative.      Objective:  Vitals:   11/16/23 1353 11/16/23 1436  BP: (!) 152/76 (!) 144/78  Pulse: 73   SpO2: 99%   Weight: 179 lb 9.6 oz (81.5 kg)   Height: 5\' 5"  (1.651 m)     General: Well developed, well nourished, in no acute distress  Skin : Warm and dry.  Head: Normocephalic and atraumatic  Eyes: Sclera and conjunctiva clear; pupils round and reactive to light; extraocular movements intact  Ears: External normal; canals clear; tympanic membranes normal  Oropharynx: Pink, supple. No suspicious lesions  Neck: Supple without thyromegaly, adenopathy  Lungs: Respirations unlabored; clear to auscultation bilaterally without wheeze, rales, rhonchi  CVS exam: normal rate and regular  rhythm.  Abdomen: Soft; nontender; nondistended; normoactive bowel sounds; no masses or hepatosplenomegaly  Musculoskeletal: No deformities; no active joint inflammation  Extremities: No edema, cyanosis, clubbing  Vessels: Symmetric bilaterally  Neurologic: Alert and oriented; speech intact; face symmetrical; moves all extremities well; CNII-XII intact without focal deficit  Assessment:  1. PE (physical exam), annual   2. Lipid screening   3. Elevated glucose   4. Elevated blood uric acid level   5. Visit for screening mammogram     Plan:  Age appropriate preventive  healthcare needs addressed; encouraged regular eye doctor and dental exams; encouraged regular exercise; encouraged to take her medications daily as prescribed- will re-check in 1 month when she is back on her medication;  Patient is due for pap smear but defers until next appointment; referral for mammogram and colonoscopy updated;   Return in about 1 month (around 12/14/2023) for follow up/ pap smear.  Orders Placed This Encounter  Procedures   MM 3D SCREENING MAMMOGRAM BILATERAL BREAST    INS:[facility] [id number] PF:05/20/2022 [facility]  WG:NFAOZH SCREENING MAMMOGRAM NO BRT ISSUES IMPLANTS OR REDUC:NO HX OF BREAST CANCER:NO NEEDS:NO PT AWARE OF 75 CX FEE-AC W PT    Standing Status:   Future    Expiration Date:   11/15/2024    Reason for Exam (SYMPTOM  OR DIAGNOSIS REQUIRED):   screening mammogram    Preferred imaging location?:   [facility]   CBC with Differential/Platelet   Comp Met (CMET)   Lipid panel   Hemoglobin A1c   Uric acid   Ambulatory referral to Gastroenterology    Referral Priority:   Routine    Referral Type:   Consultation    Referral Reason:   Specialty Services Required    Number of Visits Requested:   1    Requested Prescriptions   Signed Prescriptions Disp Refills   spironolactone (ALDACTONE) 25 MG tablet 90 tablet 3    Sig: Take 1 tablet (25 mg total) by mouth daily.   atorvastatin (LIPITOR) 20 MG tablet 90 tablet 3    Sig: Take 1 tablet (20 mg total) by mouth at bedtime.   amLODipine-benazepril (LOTREL) 10-40 MG capsule 90 capsule 3    Sig: Take 1 capsule by mouth daily.   LORazepam (ATIVAN) 0.5 MG tablet 15 tablet 0    Sig: Take 1 tablet (0.5 mg total) by mouth 2 (two) times daily as needed for anxiety.

## 2023-11-17 ENCOUNTER — Encounter: Payer: Self-pay | Admitting: Family

## 2023-11-17 LAB — COMPREHENSIVE METABOLIC PANEL
ALT: 13 U/L (ref 0–35)
AST: 16 U/L (ref 0–37)
Albumin: 4.7 g/dL (ref 3.5–5.2)
Alkaline Phosphatase: 66 U/L (ref 39–117)
BUN: 12 mg/dL (ref 6–23)
CO2: 28 meq/L (ref 19–32)
Calcium: 9.4 mg/dL (ref 8.4–10.5)
Chloride: 104 meq/L (ref 96–112)
Creatinine, Ser: 0.86 mg/dL (ref 0.40–1.20)
GFR: 72.38 mL/min (ref >60.00)
Glucose, Bld: 104 mg/dL — ABNORMAL HIGH (ref 70–99)
Potassium: 4 meq/L (ref 3.5–5.1)
Sodium: 141 meq/L (ref 135–145)
Total Bilirubin: 0.6 mg/dL (ref 0.2–1.2)
Total Protein: 7.5 g/dL (ref 6.0–8.3)

## 2023-11-17 LAB — CBC WITH DIFFERENTIAL/PLATELET
Basophils Absolute: 0.1 10*3/uL (ref 0.0–0.1)
Basophils Relative: 0.9 % (ref 0.0–3.0)
Eosinophils Absolute: 0.1 10*3/uL (ref 0.0–0.7)
Eosinophils Relative: 1.8 % (ref 0.0–5.0)
HCT: 39.2 % (ref 36.0–46.0)
Hemoglobin: 13 g/dL (ref 12.0–15.0)
Lymphocytes Relative: 25.6 % (ref 12.0–46.0)
Lymphs Abs: 2.1 10*3/uL (ref 0.7–4.0)
MCHC: 33.2 g/dL (ref 30.0–36.0)
MCV: 93.7 fL (ref 78.0–100.0)
Monocytes Absolute: 0.4 10*3/uL (ref 0.1–1.0)
Monocytes Relative: 5.3 % (ref 3.0–12.0)
Neutro Abs: 5.4 10*3/uL (ref 1.4–7.7)
Neutrophils Relative %: 66.4 % (ref 43.0–77.0)
Platelets: 254 10*3/uL (ref 150.0–400.0)
RBC: 4.19 Mil/uL (ref 3.87–5.11)
RDW: 13.9 % (ref 11.5–15.5)
WBC: 8.2 10*3/uL (ref 4.0–10.5)

## 2023-11-17 LAB — LIPID PANEL
Cholesterol: 169 mg/dL (ref 0–200)
HDL: 52.7 mg/dL (ref >39.00)
LDL Cholesterol: 86 mg/dL (ref 0–99)
NonHDL: 116.67
Total CHOL/HDL Ratio: 3
Triglycerides: 154 mg/dL — ABNORMAL HIGH (ref 0.0–149.0)
VLDL: 30.8 mg/dL (ref 0.0–40.0)

## 2023-11-17 LAB — HEMOGLOBIN A1C: Hgb A1c MFr Bld: 5.7 % (ref 4.6–6.5)

## 2023-11-17 LAB — URIC ACID: Uric Acid, Serum: 6.1 mg/dL (ref 2.4–7.0)

## 2023-11-19 ENCOUNTER — Ambulatory Visit
Admission: RE | Admit: 2023-11-19 | Discharge: 2023-11-19 | Disposition: A | Discharge status: Home / Self Care | Payer: 59 | Source: Ambulatory Visit | Attending: Family | Admitting: Family

## 2023-11-19 DIAGNOSIS — Z1231 Encounter for screening mammogram for malignant neoplasm of breast: Secondary | ICD-10-CM

## 2023-12-14 ENCOUNTER — Ambulatory Visit: Payer: 59 | Admitting: Family

## 2023-12-24 ENCOUNTER — Ambulatory Visit: Admitting: Family

## 2024-01-26 ENCOUNTER — Encounter: Payer: Self-pay | Admitting: Family

## 2024-02-04 ENCOUNTER — Encounter: Payer: Self-pay | Admitting: Family

## 2024-02-04 ENCOUNTER — Other Ambulatory Visit (HOSPITAL_BASED_OUTPATIENT_CLINIC_OR_DEPARTMENT_OTHER): Payer: Self-pay

## 2024-02-04 ENCOUNTER — Ambulatory Visit (HOSPITAL_BASED_OUTPATIENT_CLINIC_OR_DEPARTMENT_OTHER)
Admission: RE | Admit: 2024-02-04 | Discharge: 2024-02-04 | Disposition: A | Discharge status: Home / Self Care | Source: Ambulatory Visit | Attending: Family | Admitting: Family

## 2024-02-04 ENCOUNTER — Ambulatory Visit (INDEPENDENT_AMBULATORY_CARE_PROVIDER_SITE_OTHER): Admitting: Family

## 2024-02-04 VITALS — BP 122/74 | HR 67 | Ht 65.0 in | Wt 177.2 lb

## 2024-02-04 DIAGNOSIS — M542 Cervicalgia: Secondary | ICD-10-CM | POA: Insufficient documentation

## 2024-02-04 MED ORDER — METHOCARBAMOL 500 MG PO TABS
500.0000 mg | ORAL_TABLET | Freq: Three times a day (TID) | ORAL | 0 refills | Status: AC | PRN
  Filled 2024-02-04: qty 30, 10d supply, fill #0

## 2024-02-04 MED ORDER — PREDNISONE 20 MG PO TABS
ORAL_TABLET | ORAL | 0 refills | Status: AC
  Filled 2024-02-04: qty 9, 7d supply, fill #0

## 2024-02-04 NOTE — Progress Notes (Signed)
 Patty Adams is a 63 y.o. female with the following history as recorded in EpicCare:  Patient Active Problem List   Diagnosis Date Noted   Somatic dysfunction of right sacroiliac joint 11/07/2018   Nonallopathic lesion of sacral region 11/07/2018   Nonallopathic lesion of lumbosacral region 11/07/2018   Nonallopathic lesion of thoracic region 11/07/2018   Bilateral hip pain 05/05/2017   Lateral epicondylitis of right elbow 08/17/2016   Carpal tunnel syndrome of right wrist 02/27/2016   Anxiety state 03/27/2015   Right hip pain 01/10/2015   Essential hypertension 01/10/2015    Current Outpatient Medications  Medication Sig Dispense Refill   amLODipine -benazepril  (LOTREL) 10-40 MG capsule Take 1 capsule by mouth daily. 90 capsule 3   atorvastatin  (LIPITOR) 20 MG tablet Take 1 tablet (20 mg total) by mouth at bedtime. 90 tablet 3   fluticasone  (FLONASE ) 50 MCG/ACT nasal spray Place 2 sprays into both nostrils daily. 16 g 0   LORazepam  (ATIVAN ) 0.5 MG tablet Take 1 tablet (0.5 mg total) by mouth 2 (two) times daily as needed for anxiety. 15 tablet 0   methocarbamol  (ROBAXIN ) 500 MG tablet Take 1 tablet (500 mg total) by mouth every 8 (eight) hours as needed for muscle spasms. 30 tablet 0   predniSONE  (DELTASONE ) 20 MG tablet Take 2 tablets (40 mg total) by mouth daily for 2 days, THEN 1 tablet (20 mg total) daily for 5 days. 9 tablet 0   spironolactone  (ALDACTONE ) 25 MG tablet Take 1 tablet (25 mg total) by mouth daily. 90 tablet 3   No current facility-administered medications for this visit.    Allergies: Patient has no known allergies.  Past Medical History:  Diagnosis Date   Chicken pox    Hyperlipidemia    Hypertension    UTI (lower urinary tract infection)     Past Surgical History:  Procedure Laterality Date   TUBAL LIGATION      Family History  Problem Relation Age of Onset   Hyperlipidemia Mother    Hypertension Mother    Dementia Mother    Hyperlipidemia  Father    Hypertension Father    Diabetes Father    Breast cancer Maternal Aunt 15   Diabetes Maternal Grandmother    Hypertension Maternal Grandfather    Hypertension Paternal Grandmother    Diabetes Paternal Grandmother     Social History   Tobacco Use   Smoking status: Never   Smokeless tobacco: Never  Substance Use Topics   Alcohol use: Yes    Comment: occasionally    Subjective:   2-3 week history of neck/ shoulder pain; No known injury or trauma; no numbness or tingling; has tried OTC Ibuprofen  with limited relief;   Objective:  Vitals:   02/04/24 1017  BP: 122/74  Pulse: 67  SpO2: 99%  Weight: 177 lb 3.2 oz (80.4 kg)  Height: 5\' 5"  (1.651 m)    General: Well developed, well nourished, in no acute distress  Skin : Warm and dry.  Head: Normocephalic and atraumatic  Lungs: Respirations unlabored;  Musculoskeletal: No deformities; no active joint inflammation  Extremities: No edema, cyanosis, clubbing  Vessels: Symmetric bilaterally  Neurologic: Alert and oriented; speech intact; face symmetrical; moves all extremities well; CNII-XII intact without focal deficit   Assessment:  1. Neck pain     Plan:  Update cervical X-ray; Rx for prednisone  and Robaxin ; to consider PT if symptoms persist; follow up to be determined.   No follow-ups on file.  Orders Placed  This Encounter  Procedures   DG Cervical Spine Complete    Standing Status:   Future    Number of Occurrences:   1    Expiration Date:   02/03/2025    Reason for Exam (SYMPTOM  OR DIAGNOSIS REQUIRED):   neck pain    Preferred imaging location?:   [facility]    Requested Prescriptions   Signed Prescriptions Disp Refills   predniSONE  (DELTASONE ) 20 MG tablet 9 tablet 0    Sig: Take 2 tablets (40 mg total) by mouth daily for 2 days, THEN 1 tablet (20 mg total) daily for 5 days.   methocarbamol  (ROBAXIN ) 500 MG tablet 30 tablet 0    Sig: Take 1 tablet (500 mg total) by mouth every 8 (eight) hours  as needed for muscle spasms.

## 2024-02-07 ENCOUNTER — Encounter: Payer: Self-pay | Admitting: Family

## 2024-02-08 ENCOUNTER — Other Ambulatory Visit: Payer: Self-pay | Admitting: Family

## 2024-02-08 DIAGNOSIS — M542 Cervicalgia: Secondary | ICD-10-CM

## 2024-02-22 ENCOUNTER — Ambulatory Visit: Admitting: Physician Assistant

## 2024-02-24 ENCOUNTER — Other Ambulatory Visit (HOSPITAL_BASED_OUTPATIENT_CLINIC_OR_DEPARTMENT_OTHER): Payer: Self-pay

## 2024-02-24 ENCOUNTER — Encounter: Payer: Self-pay | Admitting: Physician Assistant

## 2024-02-24 ENCOUNTER — Ambulatory Visit (INDEPENDENT_AMBULATORY_CARE_PROVIDER_SITE_OTHER): Admitting: Physician Assistant

## 2024-02-24 DIAGNOSIS — M5412 Radiculopathy, cervical region: Secondary | ICD-10-CM

## 2024-02-24 NOTE — Progress Notes (Signed)
 Office Visit Note   Patient: Patty Adams           Date of Birth: [birth year]           MRN: [id number] Visit Date: 02/24/2024              Requested by: Adra Alanis, FNP [address] [place],  [place] [address] PCP: Adra Alanis, FNP   Assessment & Plan: Visit Diagnoses:  1. Radiculopathy, cervical region     Plan: Patty Adams is a pleasant 63 year old woman who comes in today with a 1 month history of pain that starts in her neck and radiates to her left shoulder and deltoid.  She denies any injuries.  She was seen by her primary care about a month ago she was placed on a steroid taper as well as ibuprofen  and given a muscle relaxant.  She is wondering if this is more from her shoulder.  I did examine her today findings seem more radicular in nature though she does not have significant weakness but she has no rotator cuff findings she certainly has some degenerative changes in her x-rays.  She also has reproduction of symptoms with moving her neck.  I recommended to her to stay on the ibuprofen  as long as she tolerates it.  Would also like her to try some sessions with physical therapy.  If she does not improve she will contact me I could order an MRI.  Follow-Up Instructions: No follow-ups on file.   Orders:  No orders of the defined types were placed in this encounter.  No orders of the defined types were placed in this encounter.     Procedures: No procedures performed   Clinical Data: No additional findings.   Subjective: No chief complaint on file.   HPI patient is a 63 year old woman who comes in today with a chief complaint of 1 month of left neck radiating to shoulder pain.  She has had no injuries.  She had x-rays about a month ago.  She has been taking ibuprofen  as needed  Review of Systems  All other systems reviewed and are negative.    Objective: Vital Signs: There were no vitals taken for this visit.  Physical  Exam Constitutional:      Appearance: Normal appearance.  Pulmonary:     Effort: Pulmonary effort is normal.  Neurological:     General: No focal deficit present.     Mental Status: She is alert and oriented to person, place, and time.  Psychiatric:        Mood and Affect: Mood normal.        Behavior: Behavior normal.    Ortho Exam Examination of her shoulder she has full range of motion with very little reproduction of her symptoms.  She has a negative empty can test she has a negative speeds sign.  Motion is good.  Strength is intact.  She has good flexion of her neck with extension it reproduces some of her left-sided symptoms.  Also turning more to the right than the left.  Strength is intact sensation is intact good biceps and triceps strength Specialty Comments:  No specialty comments available.  Imaging: No results found.   PMFS History: Patient Active Problem List   Diagnosis Date Noted   Radiculopathy, cervical region 02/24/2024   Somatic dysfunction of right sacroiliac joint 11/07/2018   Nonallopathic lesion of sacral region 11/07/2018   Nonallopathic lesion of lumbosacral region 11/07/2018  Nonallopathic lesion of thoracic region 11/07/2018   Bilateral hip pain 05/05/2017   Lateral epicondylitis of right elbow 08/17/2016   Carpal tunnel syndrome of right wrist 02/27/2016   Anxiety state 03/27/2015   Right hip pain 01/10/2015   Essential hypertension 01/10/2015   Past Medical History:  Diagnosis Date   Chicken pox    Hyperlipidemia    Hypertension    UTI (lower urinary tract infection)     Family History  Problem Relation Age of Onset   Hyperlipidemia Mother    Hypertension Mother    Dementia Mother    Hyperlipidemia Father    Hypertension Father    Diabetes Father    Breast cancer Maternal Aunt 58   Diabetes Maternal Grandmother    Hypertension Maternal Grandfather    Hypertension Paternal Grandmother    Diabetes Paternal Grandmother     Past  Surgical History:  Procedure Laterality Date   TUBAL LIGATION     Social History   Occupational History   Occupation: Risk analyst  Tobacco Use   Smoking status: Never   Smokeless tobacco: Never  Vaping Use   Vaping status: Never Used  Substance and Sexual Activity   Alcohol use: Yes    Comment: occasionally   Drug use: No   Sexual activity: Not on file

## 2024-03-22 ENCOUNTER — Ambulatory Visit: Admitting: Rehabilitative and Restorative Service Providers"

## 2024-05-16 ENCOUNTER — Ambulatory Visit: Admitting: Orthopaedic Surgery

## 2024-05-16 DIAGNOSIS — M542 Cervicalgia: Secondary | ICD-10-CM

## 2024-05-16 DIAGNOSIS — M62838 Other muscle spasm: Secondary | ICD-10-CM | POA: Insufficient documentation

## 2024-05-16 NOTE — Progress Notes (Signed)
 Office Visit Note   Patient: Patty Adams           Date of Birth: 12/16/1960           MRN: [id number] Visit Date: 05/16/2024              Requested by: Jason Leita Repine, FNP [address] [place],  [place] [address] PCP: Jason Leita Repine, FNP   Assessment & Plan: Visit Diagnoses:  1. Neck pain   2. 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  She experiences neck pain primarily in the left trapezius area, occasionally extending to the upper left arm, with no involvement of the right side. There is no numbness, tingling, or burning pain in her arms or hands. The pain does not disturb her sleep but worsens when lying on her left side, requiring a change in position.  She has been diagnosed with neck arthritis and has had x-rays. She has not undergone formal physical therapy but has tried home exercises without significant relief. Her current medications include muscle relaxers, ibuprofen, and meloxicam. Muscle relaxers provided some relief but were poorly tolerated due to side effects, and ibuprofen and meloxicam have not provided long-term relief.  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  Physical Exam NECK: Neck non-tender along midline.  She has muscular tenderness in the trapezius. MUSCULOSKELETAL: Shoulder movement and strength normal.  Results RADIOLOGY Cervical spine X-ray: Good alignment, arthritic degenerative changes, anterior vertebral body osteophytes, degenerative appearance (02/2024)  Assessment and Plan Chronic neck and upper back myofascial pain Chronic myofascial pain in trapezius area without radiation. No neurological symptoms. X-rays show degenerative changes, no acute issues. Pain likely from  postural strain. Home exercises ineffective. Muscle relaxers and meloxicam provide partial relief. Prefers non-invasive treatments. - Recommend ergonomic adjustments, such as standing desk. - Discuss acupuncture benefits, though she prefers to avoid needles. - Offer referral to formal physical therapy. - Recommend consultation with Dr. Elisabeth for potential breast reduction.  Follow-Up Instructions: No follow-ups on file.   Orders:  No orders of the defined types were placed in this encounter.  No orders of the defined types were placed in this encounter.     Procedures: No procedures performed   Clinical Data: No additional findings.   Subjective: Chief Complaint  Patient presents with   Neck - Pain    HPI  Review of Systems  Constitutional: Negative.   HENT: Negative.    Eyes: Negative.   Respiratory: Negative.    Cardiovascular: Negative.   Endocrine: Negative.   Musculoskeletal: Negative.   Neurological: Negative.   Hematological: Negative.   Psychiatric/Behavioral: Negative.    All other systems reviewed and are negative.    Objective: Vital Signs: There were no vitals taken for this visit.  Physical Exam Vitals and nursing note reviewed.  Constitutional:      Appearance: She is well-developed.  HENT:     Head: Atraumatic.     Nose: Nose normal.  Eyes:     Extraocular Movements: Extraocular movements intact.  Cardiovascular:     Pulses: Normal pulses.  Pulmonary:     Effort: Pulmonary effort is normal.  Abdominal:     Palpations: Abdomen is soft.  Musculoskeletal:  Cervical back: Neck supple.  Skin:    General: Skin is warm.     Capillary Refill: Capillary refill takes less than 2 seconds.  Neurological:     Mental Status: She is alert. Mental status is at baseline.  Psychiatric:        Behavior: Behavior normal.        Thought Content: Thought content normal.        Judgment: Judgment normal.     Ortho Exam  Specialty Comments:   No specialty comments available.  Imaging: No results found.   PMFS History: Patient Active Problem List   Diagnosis Date Noted   Trapezius muscle spasm 05/16/2024   Radiculopathy, cervical region 02/24/2024   Somatic dysfunction of right sacroiliac joint 11/07/2018   Nonallopathic lesion of sacral region 11/07/2018   Nonallopathic lesion of lumbosacral region 11/07/2018   Nonallopathic lesion of thoracic region 11/07/2018   Bilateral hip pain 05/05/2017   Lateral epicondylitis of right elbow 08/17/2016   Neck pain 02/27/2016   Carpal tunnel syndrome of right wrist 02/27/2016   Anxiety state 03/27/2015   Right hip pain 01/10/2015   Essential hypertension 01/10/2015   Past Medical History:  Diagnosis Date   Chicken pox    Hyperlipidemia    Hypertension    UTI (lower urinary tract infection)     Family History  Problem Relation Age of Onset   Hyperlipidemia Mother    Hypertension Mother    Dementia Mother    Hyperlipidemia Father    Hypertension Father    Diabetes Father    Breast cancer Maternal Aunt 21   Diabetes Maternal Grandmother    Hypertension Maternal Grandfather    Hypertension Paternal Grandmother    Diabetes Paternal Grandmother     Past Surgical History:  Procedure Laterality Date   TUBAL LIGATION     Social History   Occupational History   Occupation: Risk analyst  Tobacco Use   Smoking status: Never   Smokeless tobacco: Never  Vaping Use   Vaping status: Never Used  Substance and Sexual Activity   Alcohol use: Yes    Comment: occasionally   Drug use: No   Sexual activity: Not on file

## 2024-08-07 ENCOUNTER — Encounter: Payer: Self-pay | Admitting: Radiology

## 2024-09-30 ENCOUNTER — Telehealth: Admitting: Nurse Practitioner

## 2024-09-30 DIAGNOSIS — H9202 Otalgia, left ear: Secondary | ICD-10-CM

## 2024-09-30 MED ORDER — NEOMYCIN-POLYMYXIN-HC 1 % OT SOLN: 3.0000 [drp] | Freq: Four times a day (QID) | OTIC | 0 refills | Status: AC

## 2024-09-30 NOTE — Patient Instructions (Signed)
" °  [name], thank you for joining Haze LELON Servant, NP for today's virtual visit.  While this provider is not your primary care provider (PCP), if your PCP is located in our provider database this encounter information will be shared with them immediately following your visit.   A [facility] MyChart account gives you access to today's visit and all your visits, tests, and labs performed at [facility]  click here if you don't have a [facility] MyChart account or go to [email or web address]  Consent: (Patient) Patty Adams provided verbal consent for this virtual visit at the beginning of the encounter.  Current Medications:  Current Outpatient Medications:    NEOMYCIN -POLYMYXIN-HYDROCORTISONE (CORTISPORIN) 1 % SOLN OTIC solution, Place 3 drops into the right ear 4 (four) times daily., Disp: 10 mL, Rfl: 0   amLODipine -benazepril  (LOTREL) 10-40 MG capsule, Take 1 capsule by mouth daily., Disp: 90 capsule, Rfl: 3   atorvastatin  (LIPITOR) 20 MG tablet, Take 1 tablet (20 mg total) by mouth at bedtime., Disp: 90 tablet, Rfl: 3   fluticasone  (FLONASE ) 50 MCG/ACT nasal spray, Place 2 sprays into both nostrils daily., Disp: 16 g, Rfl: 0   LORazepam  (ATIVAN ) 0.5 MG tablet, Take 1 tablet (0.5 mg total) by mouth 2 (two) times daily as needed for anxiety., Disp: 15 tablet, Rfl: 0   methocarbamol  (ROBAXIN ) 500 MG tablet, Take 1 tablet (500 mg total) by mouth every 8 (eight) hours as needed for muscle spasms., Disp: 30 tablet, Rfl: 0   spironolactone  (ALDACTONE ) 25 MG tablet, Take 1 tablet (25 mg total) by mouth daily., Disp: 90 tablet, Rfl: 3   Medications ordered in this encounter:  Meds ordered this encounter  Medications   NEOMYCIN -POLYMYXIN-HYDROCORTISONE (CORTISPORIN) 1 % SOLN OTIC solution    Sig: Place 3 drops into the right ear 4 (four) times daily.    Dispense:  10 mL    Refill:  0    Supervising Provider:   BLAISE ALEENE KIDD [[id number]]     *If you need  refills on other medications prior to your next appointment, please contact your pharmacy*  Follow-Up: Call back or seek an in-person evaluation if the symptoms worsen or if the condition fails to improve as anticipated.  [facility] [phone]  Other Instructions Continue OTC NSAIDs for pain   If you have been instructed to have an in-person evaluation today at a local Urgent Care facility, please use the link below. It will take you to a list of all of our available [facility], including address, phone number and hours of operation. Please do not delay care.  [facility]  If you or a family member do not have a primary care provider, use the link below to schedule a visit and establish care. When you choose a [facility] primary care physician or advanced practice provider, you gain a long-term partner in health. Find a Primary Care Provider  Learn more about [facility] in-office and virtual care options: [facility] - Get Care Now  "

## 2024-09-30 NOTE — Progress Notes (Signed)
 " Virtual Visit Consent   Patty Adams, you are scheduled for a virtual visit with a [facility] provider today. Just as with appointments in the office, your consent must be obtained to participate. Your consent will be active for this visit and any virtual visit you may have with one of our providers in the next 365 days. If you have a MyChart account, a copy of this consent can be sent to you electronically.  As this is a virtual visit, video technology does not allow for your provider to perform a traditional examination. This may limit your provider's ability to fully assess your condition. If your provider identifies any concerns that need to be evaluated in person or the need to arrange testing (such as labs, EKG, etc.), we will make arrangements to do so. Although advances in technology are sophisticated, we cannot ensure that it will always work on either your end or our end. If the connection with a video visit is poor, the visit may have to be switched to a telephone visit. With either a video or telephone visit, we are not always able to ensure that we have a secure connection.  By engaging in this virtual visit, you consent to the provision of healthcare and authorize for your insurance to be billed (if applicable) for the services provided during this visit. Depending on your insurance coverage, you may receive a charge related to this service.  I need to obtain your verbal consent now. Are you willing to proceed with your visit today? Patty Adams has provided verbal consent on 09/30/2024 for a virtual visit (video or telephone). Haze LELON Servant, NP  Date: 09/30/2024 9:11 AM   Virtual Visit via Video Note   I, Haze LELON Servant, connected with  Patty Adams  ([id number], 11-11-1960) on 09/30/2024 at  9:00 AM EST by a video-enabled telemedicine application and verified that I am speaking with the correct person using two identifiers.  Location: Patient: Virtual Visit  Location Patient: Home Provider: Virtual Visit Location Provider: Home Office   I discussed the limitations of evaluation and management by telemedicine and the availability of in person appointments. The patient expressed understanding and agreed to proceed.    History of Present Illness: Patty Adams is a 63 y.o. who identifies as a female who was assigned female at birth, and is being seen today for left ear pain.  Patty Adams has been experiencing pain behind the right ear initially with hearing loss of the ear yesterday. She used an ear wax removal solution in her ear overnight and today her hearing has improved with pain behind ear currently 3/10. She has also taken ibuprofen  for pain. There is no drainage from the ear, no tragus pain or TM pain. She did experience an episode of green mucous from her throat but denies any sore throat pain today.  No URI symptoms present. No fever.    Problems:  Patient Active Problem List   Diagnosis Date Noted   Trapezius muscle spasm 05/16/2024   Radiculopathy, cervical region 02/24/2024   Somatic dysfunction of right sacroiliac joint 11/07/2018   Nonallopathic lesion of sacral region 11/07/2018   Nonallopathic lesion of lumbosacral region 11/07/2018   Nonallopathic lesion of thoracic region 11/07/2018   Bilateral hip pain 05/05/2017   Lateral epicondylitis of right elbow 08/17/2016   Neck pain 02/27/2016   Carpal tunnel syndrome of right wrist 02/27/2016   Anxiety state 03/27/2015   Right hip pain 01/10/2015  Essential hypertension 01/10/2015    Allergies: Allergies[1] Medications: Current Medications[2]  Observations/Objective: Patient is well-developed, well-nourished in no acute distress.  Resting comfortably at home.  Head is normocephalic, atraumatic.  No labored breathing.  Speech is clear and coherent with logical content.  Patient is alert and oriented at baseline.    Assessment and Plan: 1. Left ear pain (Primary) -  NEOMYCIN -POLYMYXIN-HYDROCORTISONE (CORTISPORIN) 1 % SOLN OTIC solution; Place 3 drops into the right ear 4 (four) times daily.  Dispense: 10 mL; Refill: 0 Continue OTC NSAIDs for pain  Follow Up Instructions: I discussed the assessment and treatment plan with the patient. The patient was provided an opportunity to ask questions and all were answered. The patient agreed with the plan and demonstrated an understanding of the instructions.  A copy of instructions were sent to the patient via MyChart unless otherwise noted below.    The patient was advised to call back or seek an in-person evaluation if the symptoms worsen or if the condition fails to improve as anticipated.    Haze LELON Servant, NP     [1] No Known Allergies [2]  Current Outpatient Medications:    NEOMYCIN -POLYMYXIN-HYDROCORTISONE (CORTISPORIN) 1 % SOLN OTIC solution, Place 3 drops into the right ear 4 (four) times daily., Disp: 10 mL, Rfl: 0   amLODipine -benazepril  (LOTREL) 10-40 MG capsule, Take 1 capsule by mouth daily., Disp: 90 capsule, Rfl: 3   atorvastatin  (LIPITOR) 20 MG tablet, Take 1 tablet (20 mg total) by mouth at bedtime., Disp: 90 tablet, Rfl: 3   fluticasone  (FLONASE ) 50 MCG/ACT nasal spray, Place 2 sprays into both nostrils daily., Disp: 16 g, Rfl: 0   LORazepam  (ATIVAN ) 0.5 MG tablet, Take 1 tablet (0.5 mg total) by mouth 2 (two) times daily as needed for anxiety., Disp: 15 tablet, Rfl: 0   methocarbamol  (ROBAXIN ) 500 MG tablet, Take 1 tablet (500 mg total) by mouth every 8 (eight) hours as needed for muscle spasms., Disp: 30 tablet, Rfl: 0   spironolactone  (ALDACTONE ) 25 MG tablet, Take 1 tablet (25 mg total) by mouth daily., Disp: 90 tablet, Rfl: 3  "
